# Patient Record
Sex: Male | Born: 1975 | Race: Black or African American | Hispanic: No | Marital: Single | Smoking: Current every day smoker
Health system: Southern US, Community
[De-identification: ages and names within clinical notes are randomized; demographics above are authoritative.]

## PROBLEM LIST (undated history)

## (undated) DIAGNOSIS — I10 Essential (primary) hypertension: Secondary | ICD-10-CM

## (undated) DIAGNOSIS — I2510 Atherosclerotic heart disease of native coronary artery without angina pectoris: Secondary | ICD-10-CM

## (undated) HISTORY — PX: KNEE SURGERY: SHX244

---

## 2018-03-07 ENCOUNTER — Emergency Department
Admission: EM | Admit: 2018-03-07 | Discharge: 2018-03-07 | Disposition: A | Discharge status: Home / Self Care | Payer: Self-pay | Attending: Emergency Medicine | Admitting: Emergency Medicine

## 2018-03-07 ENCOUNTER — Emergency Department: Payer: Self-pay

## 2018-03-07 ENCOUNTER — Other Ambulatory Visit: Payer: Self-pay

## 2018-03-07 DIAGNOSIS — R079 Chest pain, unspecified: Secondary | ICD-10-CM | POA: Insufficient documentation

## 2018-03-07 LAB — CBC
HCT: 45 % (ref 40.0–52.0)
HEMOGLOBIN: 15.5 g/dL (ref 13.0–18.0)
MCH: 30.7 pg (ref 26.0–34.0)
MCHC: 34.4 g/dL (ref 32.0–36.0)
MCV: 89.5 fL (ref 80.0–100.0)
PLATELETS: 200 10*3/uL (ref 150–440)
RBC: 5.03 MIL/uL (ref 4.40–5.90)
RDW: 13.2 % (ref 11.5–14.5)
WBC: 7.1 10*3/uL (ref 3.8–10.6)

## 2018-03-07 LAB — BASIC METABOLIC PANEL
ANION GAP: 9 (ref 5–15)
BUN: 12 mg/dL (ref 6–20)
CALCIUM: 9.1 mg/dL (ref 8.9–10.3)
CO2: 25 mmol/L (ref 22–32)
CREATININE: 1.33 mg/dL — AB (ref 0.61–1.24)
Chloride: 106 mmol/L (ref 101–111)
GLUCOSE: 120 mg/dL — AB (ref 65–99)
Potassium: 3.6 mmol/L (ref 3.5–5.1)
Sodium: 140 mmol/L (ref 135–145)

## 2018-03-07 LAB — TROPONIN I

## 2018-03-07 MED ORDER — ASPIRIN 81 MG PO CHEW
324.0000 mg | CHEWABLE_TABLET | Freq: Once | ORAL | Status: AC
  Administered 2018-03-07: 324 mg via ORAL
  Filled 2018-03-07: qty 4

## 2018-03-07 NOTE — Discharge Instructions (Addendum)
please return for further symptoms. Please give Dr. Glennis Brink office a call in the morning. At them know you're seen in the emergency room with chest pain. He should be able to see you for follow-up tomorrow or the next day.

## 2018-03-07 NOTE — ED Provider Notes (Signed)
[facility] Emergency Department Provider Note   ____________________________________________   First MD Initiated Contact with Patient 03/07/18 1928     (approximate)  I have reviewed the triage vital signs and the nursing notes.   HISTORY  Chief Complaint Chest Pain    HPI Steven Kirk is a 42 y.o. male Who reports he had some chest pressure in the middle of his chest*added as he got off work about 6:30 today. He felt heavy like someone was sitting on his chest. He was somewhat sweaty and short of breath. Pain is much better now almost gone. He reports he had something like this 3 weeks ago.   No past medical history on file. patient denies past medical problems There are no active problems to display for this patient.     Prior to Admission medications   Not on File    Allergies Patient has no known allergies.  No family history on file.  Social History Social History   Tobacco Use  . Smoking status: Not on file  Substance Use Topics  . Alcohol use: Not on file  . Drug use: Not on file    review of systems Constitutional: No fever/chills Eyes: No visual changes. ENT: No sore throat. Cardiovascular:  chest pain. Respiratory:  shortness of breath. Gastrointestinal: No abdominal pain.  No nausea, no vomiting.  No diarrhea.  No constipation. Genitourinary: Negative for dysuria. Musculoskeletal: Negative for back pain. Skin: Negative for rash. Neurological: Negative for headaches, focal weakness  ____________________________________________   PHYSICAL EXAM:  VITAL SIGNS: ED Triage Vitals  Enc Vitals Group     BP 03/07/18 1913 125/81     Pulse Rate 03/07/18 1913 66     Resp 03/07/18 1913 18     Temp 03/07/18 1913 98.9 F (37.2 C)     Temp Source 03/07/18 1913 Oral     SpO2 03/07/18 1913 97 %     Weight 03/07/18 1909 225 lb (102.1 kg)     Height 03/07/18 1909 5\' 9"  (1.753 m)     Head Circumference --      Peak Flow --        Pain Score 03/07/18 1908 6     Pain Loc --      Pain Edu? --      Excl. in GC? --     Constitutional: Alert and oriented. Well appearing and in no acute distress. Eyes: Conjunctivae are normal.  Head: Atraumatic. Nose: No congestion/rhinnorhea. Mouth/Throat: Mucous membranes are moist.  Oropharynx non-erythematous. Neck: No stridor. Cardiovascular: Normal rate, regular rhythm. Grossly normal heart sounds.  Good peripheral circulation. Respiratory: Normal respiratory effort.  No retractions. Lungs CTAB. Gastrointestinal: Soft and nontender. No distention. No abdominal bruits. No CVA tenderness. Musculoskeletal: No lower extremity tenderness nor edema.  No joint effusions. Neurologic:  Normal speech and language. No gross focal neurologic deficits are appreciated. No gait instability. Skin:  Skin is warm, dry and intact. No rash noted. Psychiatric: Mood and affect are normal. Speech and behavior are normal.  ____________________________________________   LABS (all labs ordered are listed, but only abnormal results are displayed)  Labs Reviewed  BASIC METABOLIC PANEL - Abnormal; Notable for the following components:      Result Value   Glucose, Bld 120 (*)    Creatinine, Ser 1.33 (*)    All other components within normal limits  CBC  TROPONIN I  TROPONIN I   ____________________________________________  EKG  EKG read and interpreted by me  shows normal sinus rhythm rate of 77 normal axis no acute changes ____________________________________________  RADIOLOGY  ED MD interpretation: chest x-ray reviewed by me shows no acute disease I agree with the radiologist   Official radiology report(s): Dg Chest 2 View  Result Date: 03/07/2018 CLINICAL DATA:  42 year old male with chest pain onset today. Mid sternal pain which is non radiating, shortness of breath. EXAM: CHEST - 2 VIEW COMPARISON:  None. FINDINGS: Cardiac size and mediastinal contours are within normal limits.  Visualized tracheal air column is within normal limits. Lung volumes are within normal limits. No pneumothorax, pulmonary edema, pleural effusion or confluent pulmonary opacity. No acute osseous abnormality identified. Negative visible bowel gas pattern. IMPRESSION: Within normal limits.  No acute cardiopulmonary abnormality. Electronically Signed   By: Odessa Fleming M.D.   On: 03/07/2018 20:43    ____________________________________________   PROCEDURES  Procedure(s) performed:   Procedures  Critical Care performed:   ____________________________________________   INITIAL IMPRESSION / ASSESSMENT AND PLAN / ED COURSE  patient's EKG chest x-ray and troponins are all essentially okay. Patient has no further chest pain. I'll have him follow-up with Dr. call wood the cardiologist tomorrow in the office.patient will return if he is worse. I believe it is okay to go home.        ____________________________________________   FINAL CLINICAL IMPRESSION(S) / ED DIAGNOSES  Final diagnoses:  Nonspecific chest pain     ED Discharge Orders    None       Note:  This document was prepared using Dragon voice recognition software and may include unintentional dictation errors.    Arnaldo Natal, MD 03/08/18 [phone]

## 2018-03-07 NOTE — ED Triage Notes (Signed)
Patient reports chest pain since earlier in the day, states pain mid sternal, non radiating with shortness of breath.

## 2018-11-21 ENCOUNTER — Emergency Department: Payer: Self-pay

## 2018-11-21 ENCOUNTER — Other Ambulatory Visit: Payer: Self-pay

## 2018-11-21 ENCOUNTER — Emergency Department
Admission: EM | Admit: 2018-11-21 | Discharge: 2018-11-21 | Disposition: A | Discharge status: Home / Self Care | Payer: Self-pay | Attending: Emergency Medicine | Admitting: Emergency Medicine

## 2018-11-21 DIAGNOSIS — R0789 Other chest pain: Secondary | ICD-10-CM | POA: Insufficient documentation

## 2018-11-21 DIAGNOSIS — R55 Syncope and collapse: Secondary | ICD-10-CM | POA: Insufficient documentation

## 2018-11-21 LAB — CBC
HCT: 43.4 % (ref 39.0–52.0)
HEMOGLOBIN: 14.8 g/dL (ref 13.0–17.0)
MCH: 31 pg (ref 26.0–34.0)
MCHC: 34.1 g/dL (ref 30.0–36.0)
MCV: 91 fL (ref 80.0–100.0)
Platelets: 186 10*3/uL (ref 150–400)
RBC: 4.77 MIL/uL (ref 4.22–5.81)
RDW: 11.9 % (ref 11.5–15.5)
WBC: 6 10*3/uL (ref 4.0–10.5)
nRBC: 0 % (ref 0.0–0.2)

## 2018-11-21 LAB — BASIC METABOLIC PANEL
ANION GAP: 8 (ref 5–15)
BUN: 15 mg/dL (ref 6–20)
CHLORIDE: 106 mmol/L (ref 98–111)
CO2: 25 mmol/L (ref 22–32)
Calcium: 9.2 mg/dL (ref 8.9–10.3)
Creatinine, Ser: 1.19 mg/dL (ref 0.61–1.24)
GFR calc Af Amer: >60 mL/min (ref >60)
Glucose, Bld: 140 mg/dL — ABNORMAL HIGH (ref 70–99)
POTASSIUM: 3.7 mmol/L (ref 3.5–5.1)
SODIUM: 139 mmol/L (ref 135–145)

## 2018-11-21 LAB — TROPONIN I

## 2018-11-21 NOTE — ED Notes (Signed)
Reviewed discharge instructions, follow-up care with patient. Patient verbalized understanding of all information reviewed. Patient stable, with no distress noted at this time.    

## 2018-11-21 NOTE — ED Triage Notes (Signed)
Central CP today, states goes to back. States SOB. Talking in complete sentences. Does not appear in any distress. A&O, ambulatory. No diaphoresis.

## 2018-11-21 NOTE — ED Provider Notes (Signed)
[facility] Emergency Department Provider Note ____________________________________________   First MD Initiated Contact with Patient 11/21/18 1827     (approximate)  I have reviewed the triage vital signs and the nursing notes.   HISTORY  Chief Complaint Chest Pain    HPI Steven Kirk is a 42 y.o. male with PMH as noted below who presents with near syncope, acute onset this afternoon when he was going to work, occurring after he ate a [place] for lunch, and consisting of feeling lightheaded, weak, and sweaty.  The patient states that that he then had some chest discomfort and shortness of breath which lasted a few minutes.  It has now completely resolved.  He reports having had a similar episode once a few months ago.   History reviewed. No pertinent past medical history.  There are no active problems to display for this patient.   Past Surgical History:  Procedure Laterality Date  . KNEE SURGERY      Prior to Admission medications   Not on File    Allergies Patient has no known allergies.  History reviewed. No pertinent family history.  Social History Social History   Tobacco Use  . Smoking status: Never Smoker  Substance Use Topics  . Alcohol use: Not Currently  . Drug use: Not on file    Review of Systems  Constitutional: No fever. Eyes: No redness. ENT: No sore throat. Cardiovascular: Positive for resolved chest discomfort. Respiratory: Positive for resolved shortness of breath. Gastrointestinal: No vomiting or diarrhea.  Genitourinary: Negative for flank pain. Musculoskeletal: Negative for back pain. Skin: Negative for rash. Neurological: Negative for headaches, focal weakness or numbness.   ____________________________________________   PHYSICAL EXAM:  VITAL SIGNS: ED Triage Vitals [11/21/18 1453]  Enc Vitals Group     BP (!) 134/91     Pulse Rate 69     Resp 18     Temp 98.2 F (36.8 C)     Temp Source Oral     SpO2 98 %     Weight 215 lb (97.5 kg)     Height 5\' 9"  (1.753 m)     Head Circumference      Peak Flow      Pain Score 6     Pain Loc      Pain Edu?      Excl. in GC?     Constitutional: Alert and oriented. Well appearing and in no acute distress. Eyes: Conjunctivae are normal.  EOMI.  PERRLA. Head: Atraumatic. Nose: No congestion/rhinnorhea. Mouth/Throat: Mucous membranes are moist.   Neck: Normal range of motion.  Cardiovascular: Normal rate, regular rhythm. Grossly normal heart sounds.  Good peripheral circulation. Respiratory: Normal respiratory effort.  No retractions. Lungs CTAB. Gastrointestinal: No distention.  Musculoskeletal:Extremities warm and well perfused.  Neurologic:  Normal speech and language.  Normal coordination and gait.  No gross focal neurologic deficits are appreciated.  Skin:  Skin is warm and dry. No rash noted. Psychiatric: Mood and affect are normal. Speech and behavior are normal.  ____________________________________________   LABS (all labs ordered are listed, but only abnormal results are displayed)  Labs Reviewed  BASIC METABOLIC PANEL - Abnormal; Notable for the following components:      Result Value   Glucose, Bld 140 (*)    All other components within normal limits  CBC  TROPONIN I   ____________________________________________  EKG  ED ECG REPORT I, Dionne Bucy, the attending physician, personally viewed and interpreted this ECG.  Date: 11/21/2018 EKG Time: 1451 Rate: 71 Rhythm: normal sinus rhythm QRS Axis: normal Intervals: normal ST/T Wave abnormalities: normal Narrative Interpretation: no evidence of acute ischemia  ____________________________________________  RADIOLOGY  CXR: No focal infiltrate  ____________________________________________   PROCEDURES  Procedure(s) performed: No  Procedures  Critical Care performed: No ____________________________________________   INITIAL IMPRESSION /  ASSESSMENT AND PLAN / ED COURSE  Pertinent labs & imaging results that were available during my care of the patient were reviewed by me and considered in my medical decision making (see chart for details).  42 year old male with no significant past medical history presents with near syncope earlier today associated with some chest discomfort and shortness of breath that appears to have been rather brief and has now resolved for several hours.  He states it happened not long after eating [place] food for lunch and while rushing to get to work.  I reviewed the past medical records in epic; the patient was seen in March of this year for a similar presentation.  He was ruled out for ACS and referred to a cardiologist although he states he did not go because he did not have insurance at that time.  He states that his insurance will be kicking in at the beginning of next month.  On exam the patient is well-appearing and at this time completely asymptomatic.  His vital signs are normal.  Lab work-up obtained from triage is also normal including troponin.  His EKG is nonischemic.  Overall the presentation is consistent with a vasovagal near syncope.  There is no evidence of ACS.  Given the completely resolved symptoms and highly atypical nature of the pain as well as the patient's age and lack of risk factors, there is no indication for further ED work-up or repeat labs.  He feels well and wants to go home.  I counseled the patient on the results of the work-up.  He agrees to follow-up this time, once his insurance kicks in in January.  I think that this would be appropriate.  I give the patient thorough return precautions and he expressed understanding.   ____________________________________________   FINAL CLINICAL IMPRESSION(S) / ED DIAGNOSES  Final diagnoses:  Near syncope  Atypical chest pain      NEW MEDICATIONS STARTED DURING THIS VISIT:  There are no discharge medications for this  patient.    Note:  This document was prepared using Dragon voice recognition software and may include unintentional dictation errors.    Dionne Bucy, MD 11/21/18 2010

## 2018-11-21 NOTE — Discharge Instructions (Addendum)
Return to the ER for new, worsening, persistent severe chest pain, difficulty breathing, lightheadedness, feeling like you are going to pass out, or any other new or worsening symptoms that concern you.  You should follow-up with a primary care doctor when you get your insurance in order next month.  Based on the symptoms you have had today it would be a good idea for you to follow-up with a cardiologist as well, and referral information is been provided.

## 2019-05-02 IMAGING — CR DG CHEST 2V
1 series · 2 of 2 positions shown · non-contrast
Comparison: None.

CLINICAL DATA: 41-year-old male with chest pain onset today. Mid
sternal pain which is non radiating, shortness of breath.

EXAM:
CHEST - 2 VIEW

[Series 1: dg chest 2 view · 0.14mm/px · 2 of 2 slices shown]
[im 1/2]
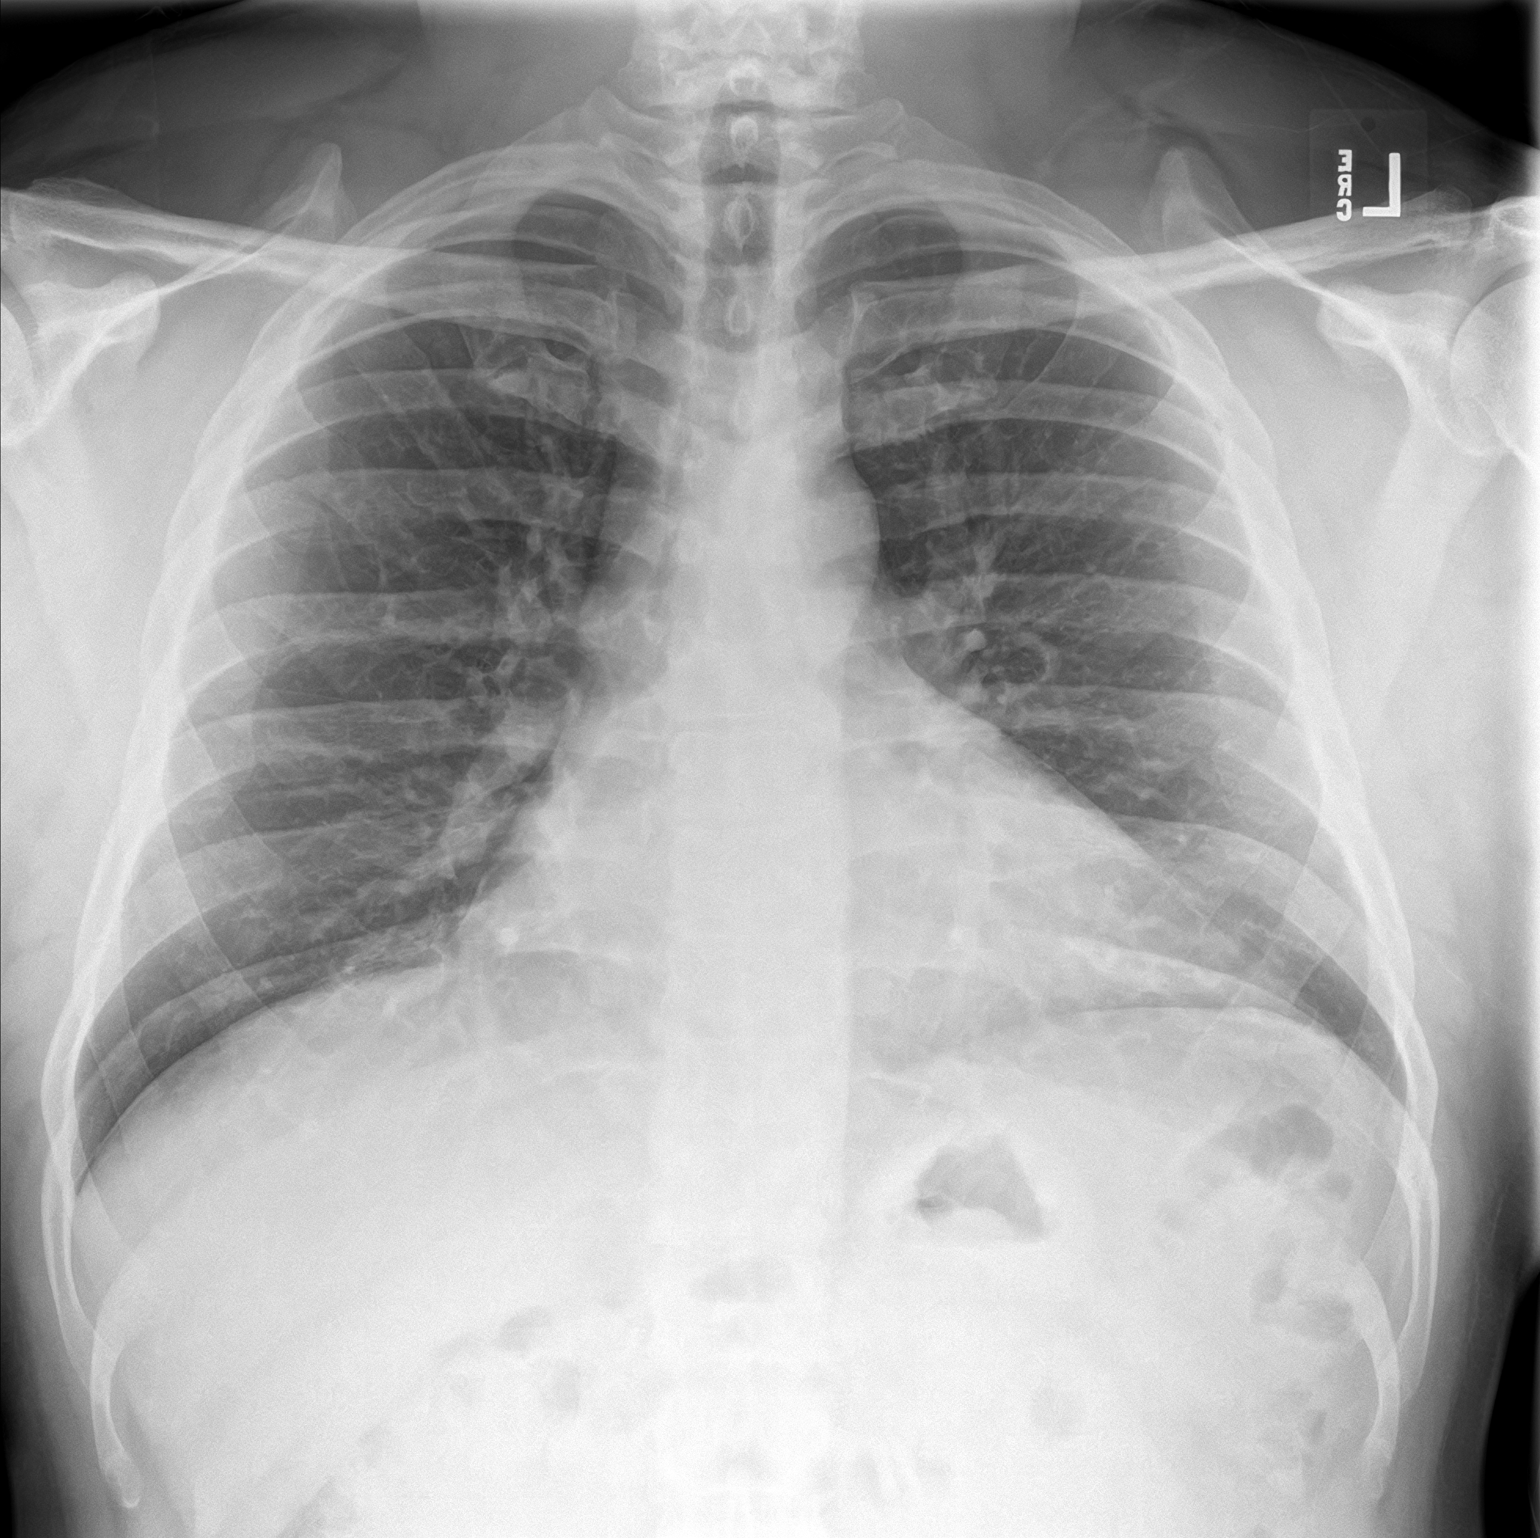
[im 2/2]
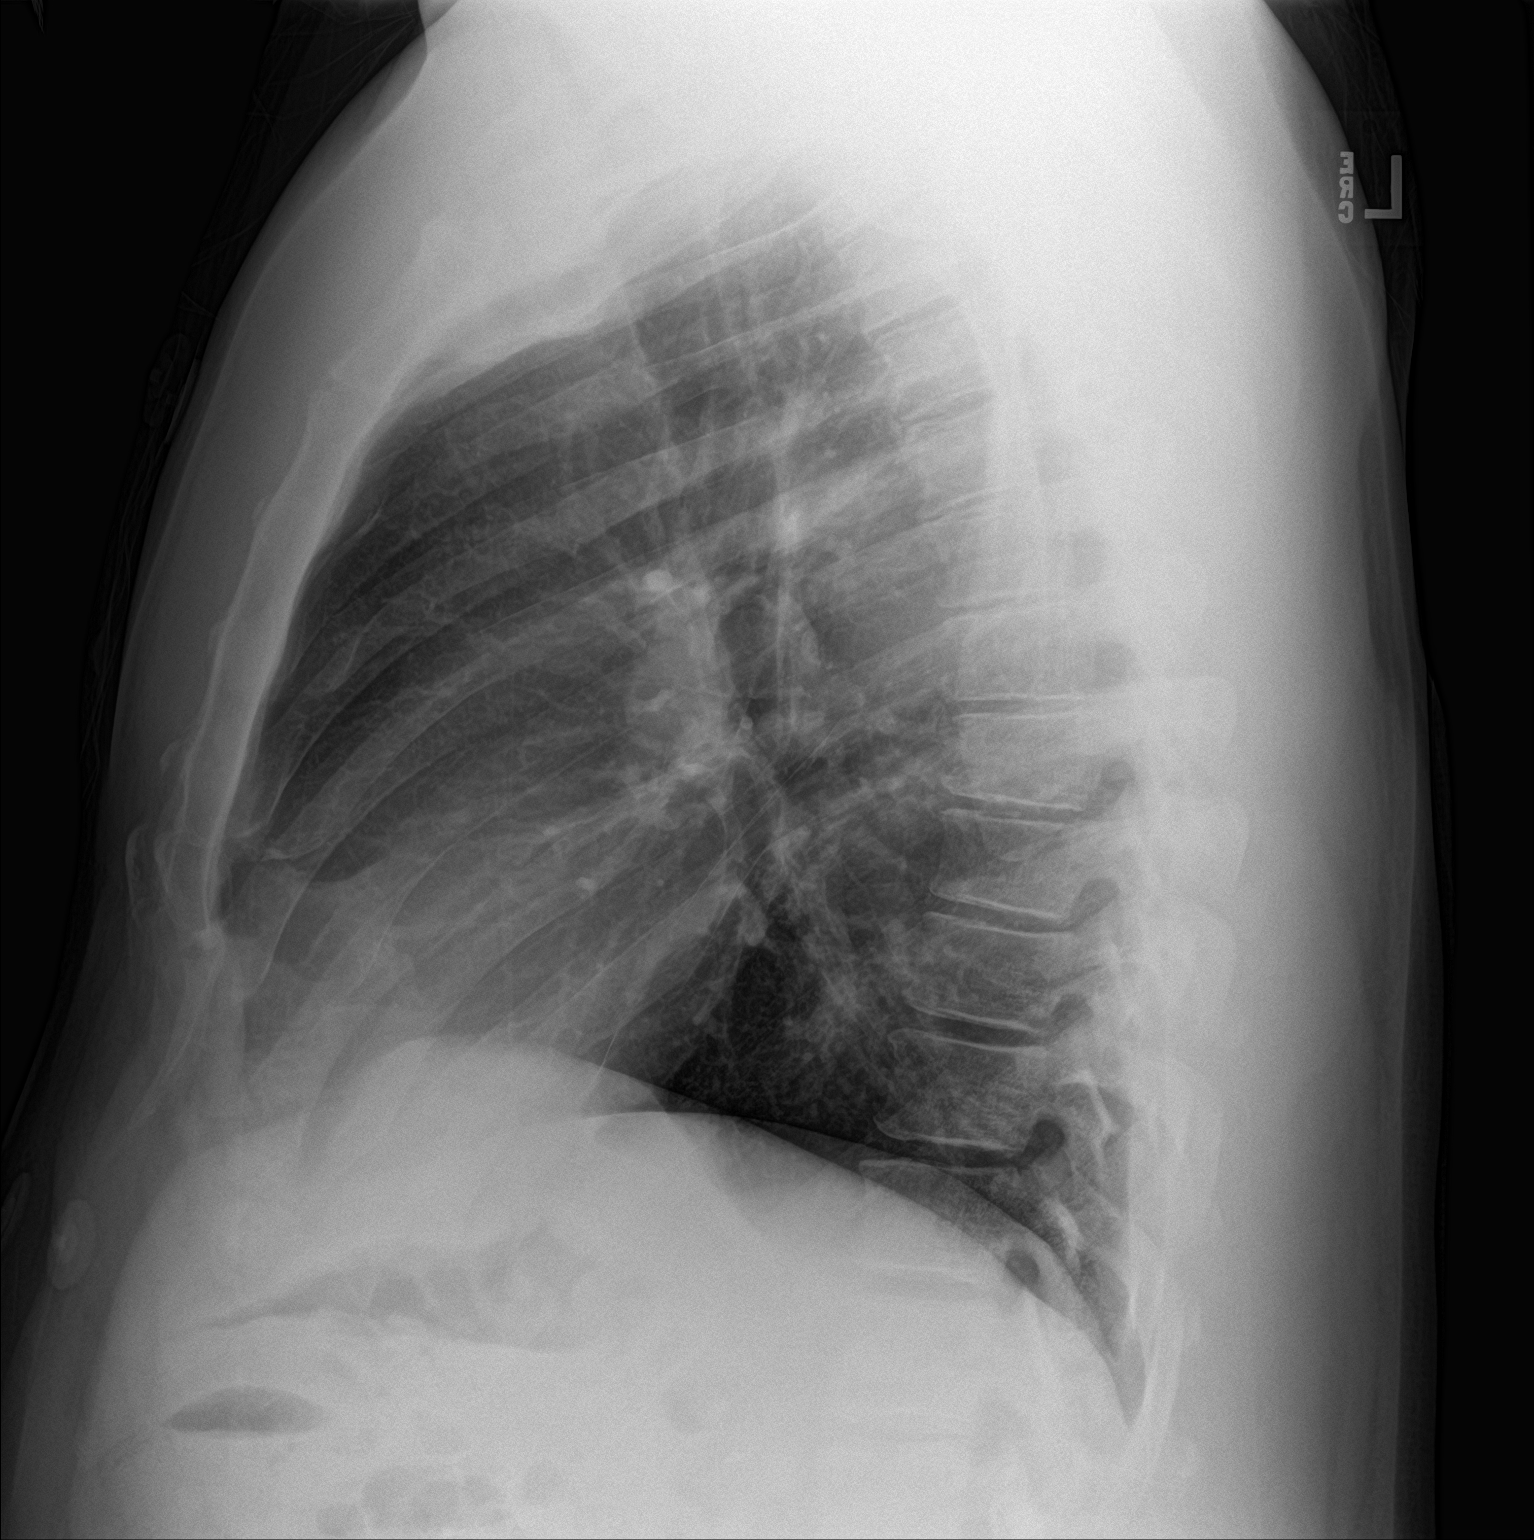

[2 of 2 positions shown; findings below may reference images not displayed]

FINDINGS: Cardiac size and mediastinal contours are within normal limits.
Visualized tracheal air column is within normal limits. Lung volumes
are within normal limits. No pneumothorax, pulmonary edema, pleural
effusion or confluent pulmonary opacity. No acute osseous
abnormality identified. Negative visible bowel gas pattern.
IMPRESSION: Within normal limits.  No acute cardiopulmonary abnormality.

## 2019-11-28 ENCOUNTER — Other Ambulatory Visit: Payer: Self-pay

## 2019-11-28 DIAGNOSIS — Z20822 Contact with and (suspected) exposure to covid-19: Secondary | ICD-10-CM

## 2019-11-30 LAB — NOVEL CORONAVIRUS, NAA: SARS-CoV-2, NAA: NOT DETECTED

## 2020-01-16 IMAGING — CR DG CHEST 2V
1 series · 2 of 2 positions shown · non-contrast
Comparison: 03/07/2018

CLINICAL DATA: Intermittent chest pain, shortness of breath

EXAM:
CHEST - 2 VIEW

[Series 1: dg chest 2 view · 0.14mm/px · 2 of 2 slices shown]
[im 1/2]
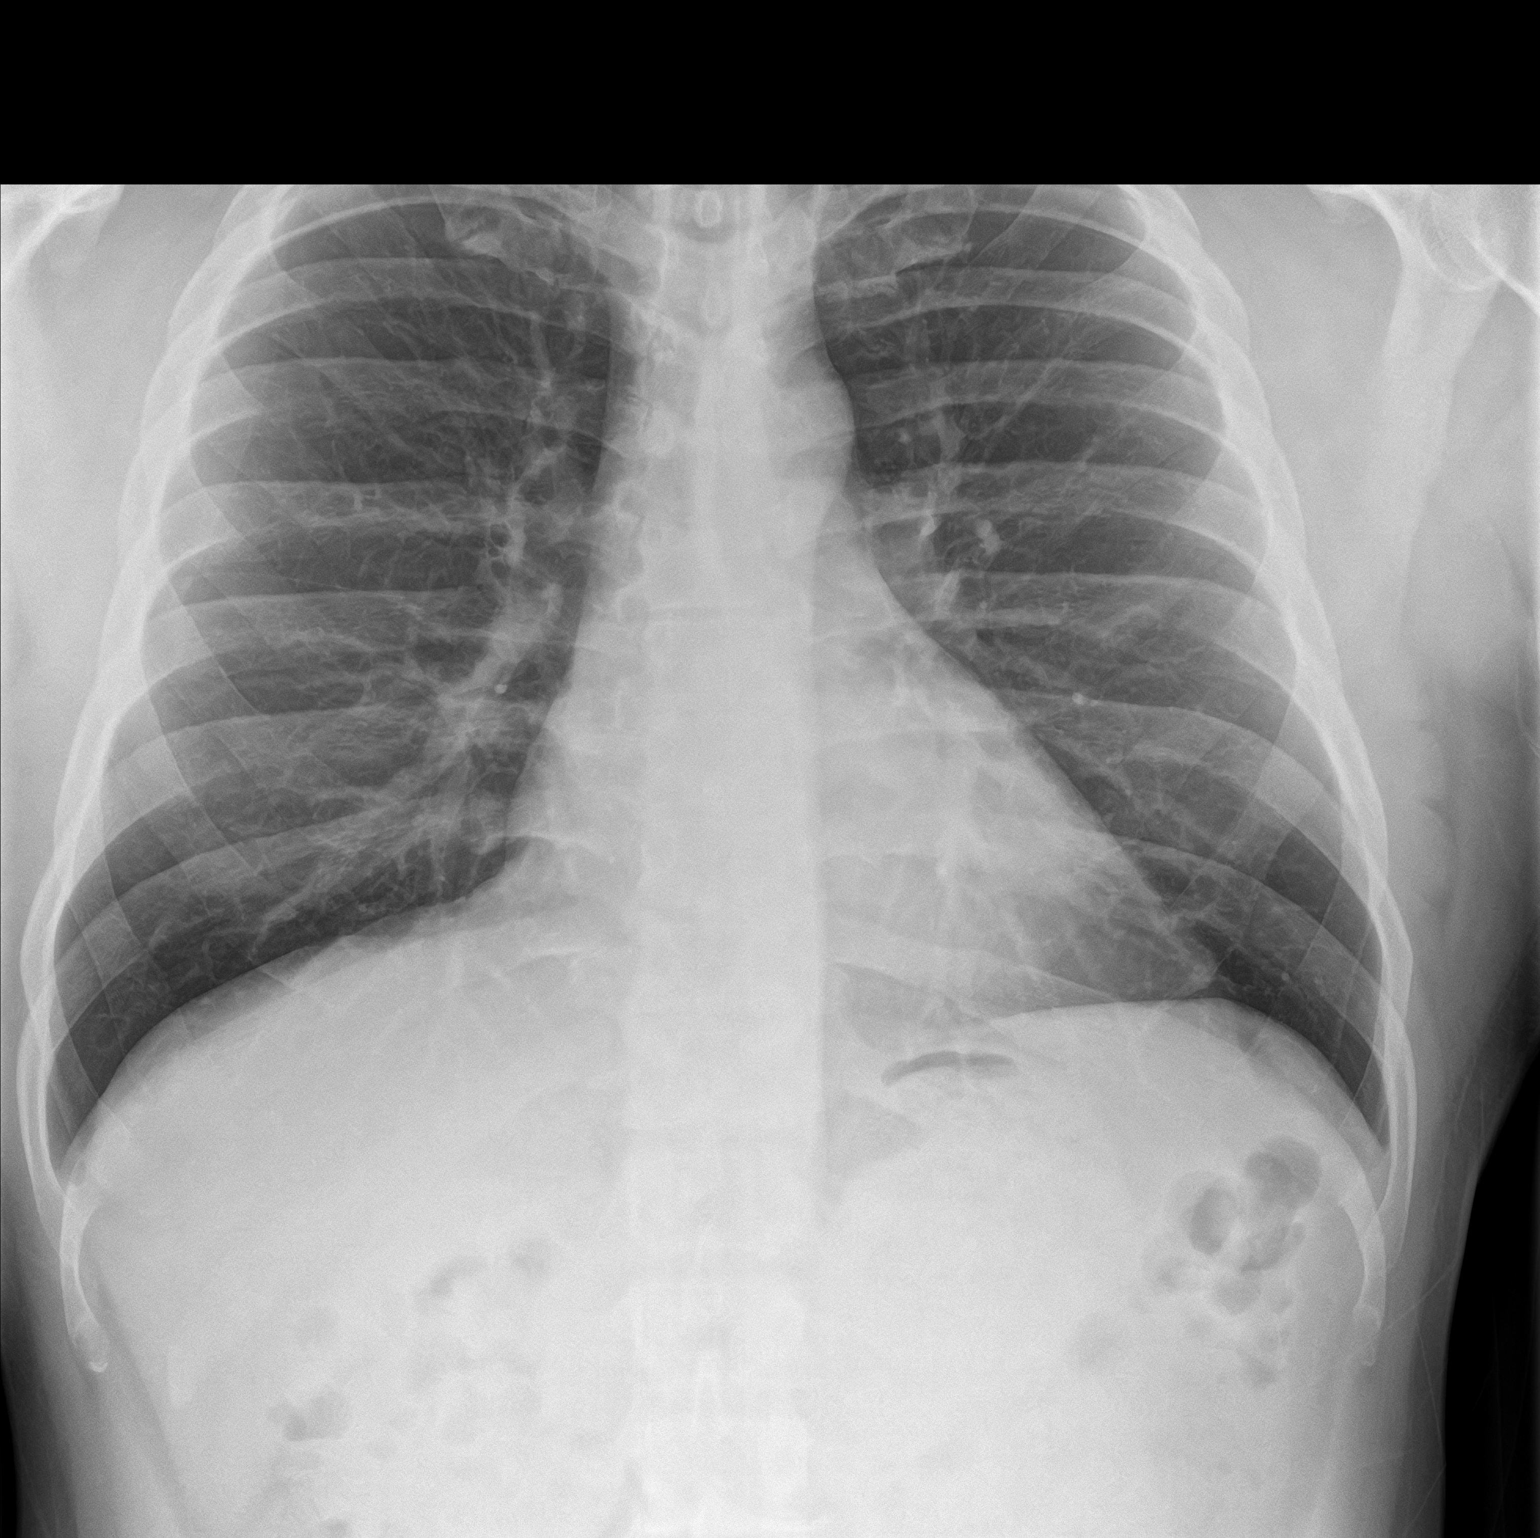
[im 2/2]
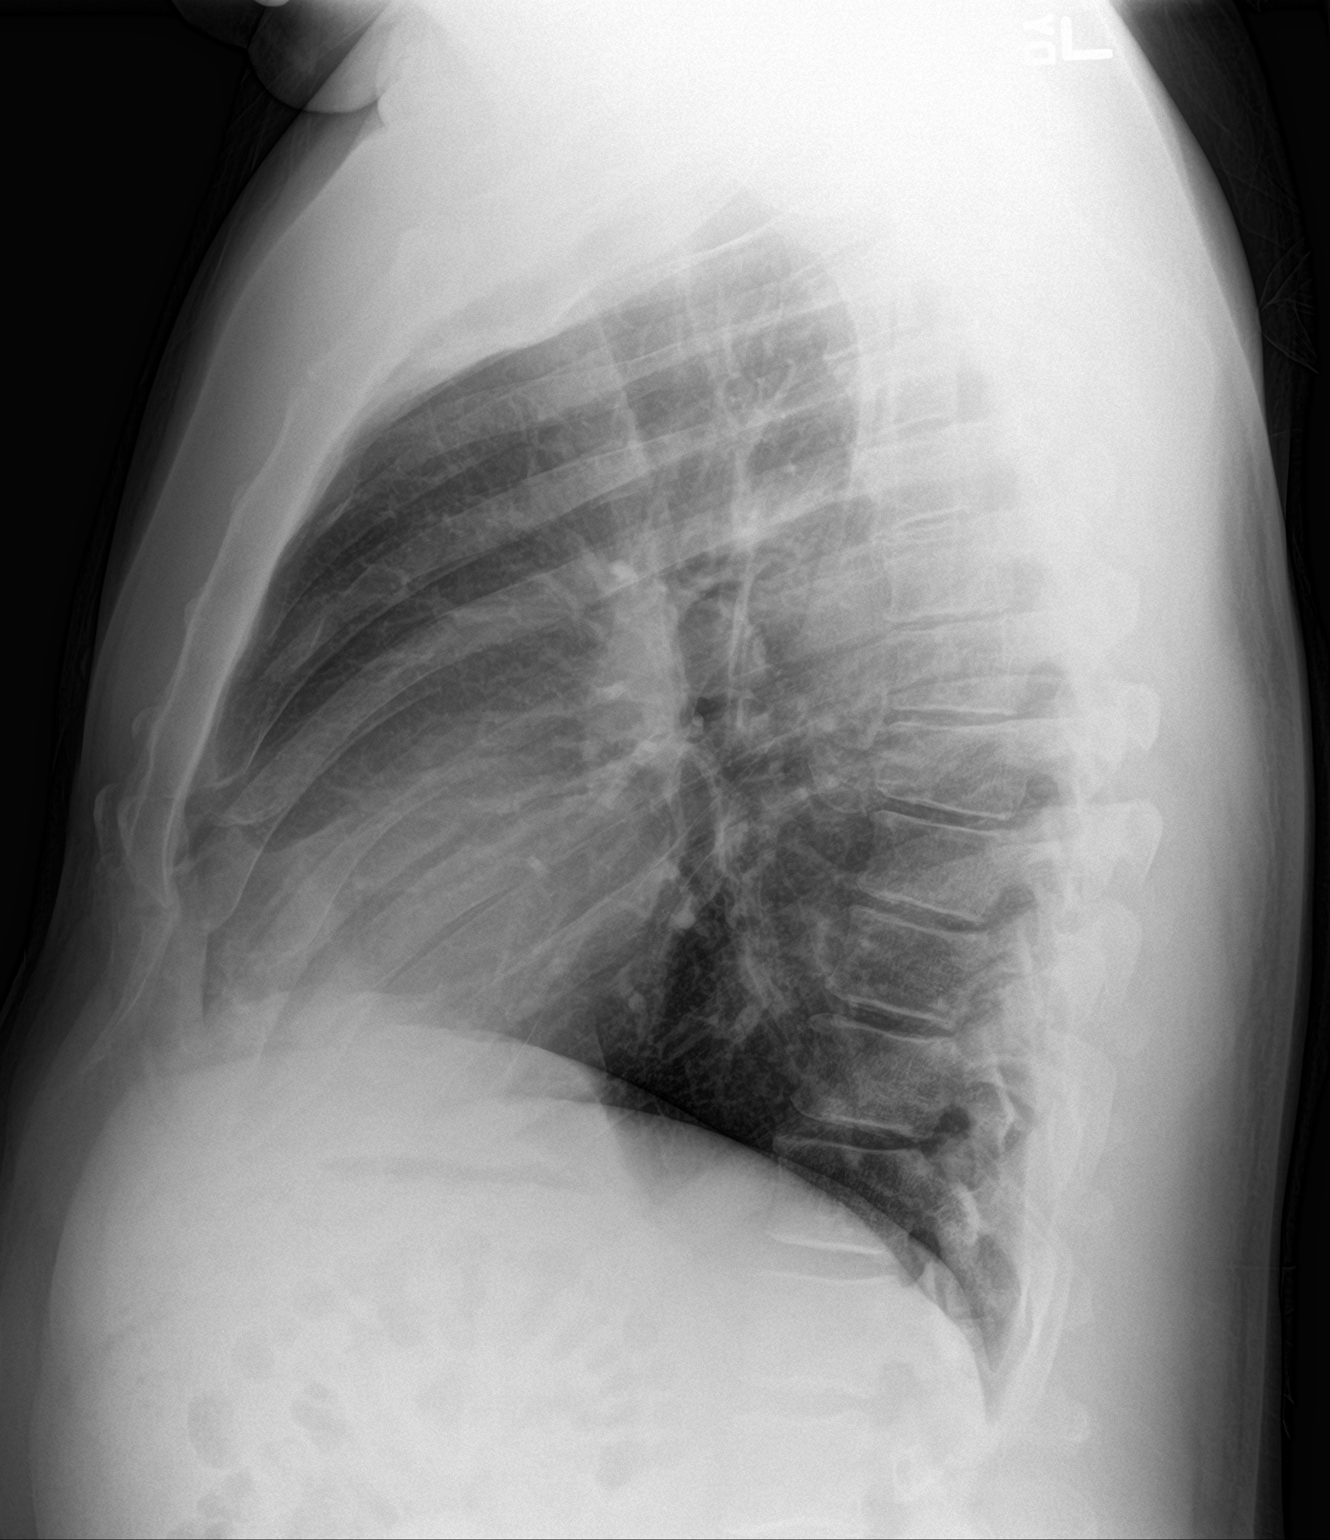

[2 of 2 positions shown; findings below may reference images not displayed]

FINDINGS: Lungs are clear.  No pleural effusion or pneumothorax.

The heart is top-normal in size.

Visualized osseous structures are within normal limits.
IMPRESSION: Normal chest radiographs.

## 2021-01-04 ENCOUNTER — Ambulatory Visit: Payer: Medicaid Other

## 2021-01-08 ENCOUNTER — Ambulatory Visit: Payer: BLUE CROSS/BLUE SHIELD | Admitting: Physician Assistant

## 2021-01-08 ENCOUNTER — Other Ambulatory Visit: Payer: Self-pay

## 2021-01-08 DIAGNOSIS — Z113 Encounter for screening for infections with a predominantly sexual mode of transmission: Secondary | ICD-10-CM

## 2021-01-08 LAB — GRAM STAIN

## 2021-01-08 NOTE — Progress Notes (Unsigned)
Gram stain reviewed; no tx per SO Ramatoulaye Pack, RN  

## 2021-01-09 ENCOUNTER — Encounter: Payer: Self-pay | Admitting: Physician Assistant

## 2021-01-09 NOTE — Progress Notes (Signed)
   [facility] Department STI clinic/screening visit  Subjective:  Steven Kirk is a 45 y.o. male being seen today for an STI screening visit. The patient reports they do not have symptoms.    Patient has the following medical conditions:  There are no problems to display for this patient.    Chief Complaint  Patient presents with  . SEXUALLY TRANSMITTED DISEASE    screening    HPI  Patient reports that he is not having any symptoms but would like a screening.  States that he had a condom break during sex and wants to make sure everything is good.  Denies chronic conditions and regular medicines.  States last HIV test was 4 years ago and last void prior to sample collection for Gram stain was 1.5 hr ago.   See flowsheet for further details and programmatic requirements.    The following portions of the patient's history were reviewed and updated as appropriate: allergies, current medications, past medical history, past social history, past surgical history and problem list.  Objective:  There were no vitals filed for this visit.  Physical Exam Constitutional:      General: He is not in acute distress.    Appearance: Normal appearance.  HENT:     Head: Normocephalic and atraumatic.     Comments: No nits,lice, or hair loss. No cervical, supraclavicular or axillary adenopathy.    Mouth/Throat:     Mouth: Mucous membranes are moist.     Pharynx: Oropharynx is clear. No oropharyngeal exudate or posterior oropharyngeal erythema.  Eyes:     Conjunctiva/sclera: Conjunctivae normal.  Pulmonary:     Effort: Pulmonary effort is normal.  Abdominal:     Palpations: Abdomen is soft. There is no mass.     Tenderness: There is no abdominal tenderness. There is no guarding or rebound.  Genitourinary:    Penis: Normal.      Testes: Normal.     Comments: Pubic area without nits, lice, hair loss, edema, erythema, lesions and inguinal adenopathy. Penis circumcised  without rash, lesions and discharge at meatus. Musculoskeletal:     Cervical back: Neck supple. No tenderness.  Skin:    General: Skin is warm and dry.     Findings: No bruising, erythema, lesion or rash.  Neurological:     Mental Status: He is alert and oriented to person, place, and time.  Psychiatric:        Mood and Affect: Mood normal.        Behavior: Behavior normal.        Thought Content: Thought content normal.        Judgment: Judgment normal.       Assessment and Plan:  Steven Kirk is a 45 y.o. male presenting to the [facility] Department for STI screening  1. Screening for STD (sexually transmitted disease) Patient into clinic without symptoms. Rec condoms with all sex. Await test results.  Counseled that RN will call if needs to RTC for treatment once results are back. - Gram stain - Gonococcus culture - HIV [facility] - Syphilis Serology, [facility]     No follow-ups on file.  No future appointments.  Steven Holmes, PA

## 2021-01-13 LAB — GONOCOCCUS CULTURE

## 2021-10-05 ENCOUNTER — Other Ambulatory Visit: Payer: Self-pay

## 2021-10-05 ENCOUNTER — Emergency Department
Admission: EM | Admit: 2021-10-05 | Discharge: 2021-10-05 | Disposition: A | Discharge status: Home / Self Care | Payer: BLUE CROSS/BLUE SHIELD | Attending: Emergency Medicine | Admitting: Emergency Medicine

## 2021-10-05 ENCOUNTER — Encounter: Payer: Self-pay | Admitting: Emergency Medicine

## 2021-10-05 DIAGNOSIS — B353 Tinea pedis: Secondary | ICD-10-CM | POA: Insufficient documentation

## 2021-10-05 DIAGNOSIS — M79672 Pain in left foot: Secondary | ICD-10-CM | POA: Diagnosis present

## 2021-10-05 MED ORDER — TERBINAFINE HCL 250 MG PO TABS: 250.0000 mg | ORAL_TABLET | Freq: Every day | ORAL | 0 refills | Status: DC

## 2021-10-05 MED ORDER — TERBINAFINE HCL 250 MG PO TABS: 250.0000 mg | ORAL_TABLET | Freq: Every day | ORAL | 0 refills | Status: AC

## 2021-10-05 NOTE — Discharge Instructions (Signed)
Use tinactin powder in your shoes and socks Lamisil cream between toes Spray your shower with a bleach product after showering Wash your sheets/towels with bleach added and dry on high heat Return if worsening

## 2021-10-05 NOTE — ED Provider Notes (Signed)
[facility] Emergency Department Provider Note  ____________________________________________   Event Date/Time   First MD Initiated Contact with Patient 10/05/21 [phone]     (approximate)  I have reviewed the triage vital signs and the nursing notes.   HISTORY  Chief Complaint Foot Pain    HPI Jatorian Dewayne Cassidy is a 45 y.o. male presents emergency department neck complaining of athlete's foot in between his toes on his left foot.  Symptoms have been ongoing for few weeks.  He tried multiple over-the-counter products without any relief.  No fever or chills.  No drainage from the area.  No redness   History reviewed. No pertinent past medical history.  There are no problems to display for this patient.   Past Surgical History:  Procedure Laterality Date   KNEE SURGERY      Prior to Admission medications   Medication Sig Start Date End Date Taking? Authorizing Provider  terbinafine (LAMISIL) 250 MG tablet Take 1 tablet (250 mg total) by mouth daily. 10/05/21 11/16/21  Faythe Ghee, PA-C    Allergies Patient has no known allergies.  No family history on file.  Social History Social History   Tobacco Use   Smoking status: Never   Smokeless tobacco: Never  Substance Use Topics   Alcohol use: Not Currently   Drug use: Not Currently    Review of Systems  Constitutional: No fever/chills Eyes: No visual changes. ENT: No sore throat. Respiratory: Denies cough Genitourinary: Negative for dysuria. Musculoskeletal: Negative for back pain.  Positive for left toe pain Skin: Negative for rash. Psychiatric: no mood changes,     ____________________________________________   PHYSICAL EXAM:  VITAL SIGNS: ED Triage Vitals  Enc Vitals Group     BP 10/05/21 0716 136/85     Pulse Rate 10/05/21 0716 98     Resp 10/05/21 0716 20     Temp 10/05/21 0716 98 F (36.7 C)     Temp Source 10/05/21 0716 Oral     SpO2 10/05/21 0716 99 %      Weight 10/05/21 0714 190 lb (86.2 kg)     Height 10/05/21 0714 5\' 9"  (1.753 m)     Head Circumference --      Peak Flow --      Pain Score 10/05/21 0714 10     Pain Loc --      Pain Edu? --      Excl. in GC? --     Constitutional: Alert and oriented. Well appearing and in no acute distress. Eyes: Conjunctivae are normal.  Head: Atraumatic. Nose: No congestion/rhinnorhea. Mouth/Throat: Mucous membranes are moist.   Neck:  supple no lymphadenopathy noted Cardiovascular: Normal rate, regular rhythm.  Respiratory: Normal respiratory effort.  No retractions,  GU: deferred Musculoskeletal: FROM all extremities, warm and well perfused Neurologic:  Normal speech and language.  Skin:  Skin is warm, dry and intact.  Tinea noted between the toes, mild swelling of the left fifth toe due to the amount of tinea noted, neurovascular is intact, no bony tenderness, no redness or pus noted Psychiatric: Mood and affect are normal. Speech and behavior are normal.  ____________________________________________   LABS (all labs ordered are listed, but only abnormal results are displayed)  Labs Reviewed - No data to display ____________________________________________   ____________________________________________  RADIOLOGY    ____________________________________________   PROCEDURES  Procedure(s) performed: No  Procedures    ____________________________________________   INITIAL IMPRESSION / ASSESSMENT AND PLAN / ED COURSE  Pertinent labs & imaging results that were available during my care of the patient were reviewed by me and considered in my medical decision making (see chart for details).   Patient is a 45 year old male presents with athlete's foot.  See HPI.  Physical exam shows patient appears stable.  Exam is consistent with tinea.  Went into great detail with patient on what to do for tinea.  We will add terbinafine 250 mg daily for 1 month.  He is to follow-up with  dermatology if not improving in that time.  Return emergency department worsening.  Discharged in stable condition.     Cordarrel Dewayne Eagles was evaluated in Emergency Department on 10/05/2021 for the symptoms described in the history of present illness. He was evaluated in the context of the global COVID-19 pandemic, which necessitated consideration that the patient might be at risk for infection with the SARS-CoV-2 virus that causes COVID-19. Institutional protocols and algorithms that pertain to the evaluation of patients at risk for COVID-19 are in a state of rapid change based on information released by regulatory bodies including the CDC and federal and state organizations. These policies and algorithms were followed during the patient's care in the ED.    As part of my medical decision making, I reviewed the following data within the electronic MEDICAL RECORD NUMBER Nursing notes reviewed and incorporated, Old chart reviewed, Notes from prior ED visits, and [place] Controlled Substance Database  ____________________________________________   FINAL CLINICAL IMPRESSION(S) / ED DIAGNOSES  Final diagnoses:  Athlete's foot on left      NEW MEDICATIONS STARTED DURING THIS VISIT:  Current Discharge Medication List     START taking these medications   Details  terbinafine (LAMISIL) 250 MG tablet Take 1 tablet (250 mg total) by mouth daily. Qty: 42 tablet, Refills: 0         Note:  This document was prepared using Dragon voice recognition software and may include unintentional dictation errors.    Faythe Ghee, PA-C 10/05/21 0825    Arnaldo Natal, MD 10/05/21 [phone]

## 2021-10-05 NOTE — ED Triage Notes (Signed)
Pt reports took a shower in someones elses shower and thinks he has an infection to a toe on his left foot.

## 2022-08-06 ENCOUNTER — Emergency Department: Payer: BLUE CROSS/BLUE SHIELD

## 2022-08-06 ENCOUNTER — Encounter: Payer: Self-pay | Admitting: Emergency Medicine

## 2022-08-06 DIAGNOSIS — M25551 Pain in right hip: Secondary | ICD-10-CM | POA: Diagnosis present

## 2022-08-06 DIAGNOSIS — W010XXA Fall on same level from slipping, tripping and stumbling without subsequent striking against object, initial encounter: Secondary | ICD-10-CM | POA: Diagnosis not present

## 2022-08-06 NOTE — ED Triage Notes (Signed)
Pt presents via EMS with complaints of right hip pain following a fall. He notes landing on his knees - denies pain in the legs but has had some severe leg pain. Pt ambulated to the car and was able to drive to his house but the pain was so bad he called 911. No deformities nor shortening noted. Pt took an unknown pain medication PTA.  Denies CP or SOB.

## 2022-08-07 ENCOUNTER — Emergency Department
Admission: EM | Admit: 2022-08-07 | Discharge: 2022-08-07 | Disposition: A | Discharge status: Home / Self Care | Payer: BLUE CROSS/BLUE SHIELD | Attending: Emergency Medicine | Admitting: Emergency Medicine

## 2022-08-07 ENCOUNTER — Emergency Department: Payer: BLUE CROSS/BLUE SHIELD

## 2022-08-07 DIAGNOSIS — I213 ST elevation (STEMI) myocardial infarction of unspecified site: Secondary | ICD-10-CM | POA: Diagnosis not present

## 2022-08-07 DIAGNOSIS — W19XXXA Unspecified fall, initial encounter: Secondary | ICD-10-CM

## 2022-08-07 DIAGNOSIS — M25551 Pain in right hip: Secondary | ICD-10-CM

## 2022-08-07 DIAGNOSIS — I2119 ST elevation (STEMI) myocardial infarction involving other coronary artery of inferior wall: Secondary | ICD-10-CM | POA: Diagnosis not present

## 2022-08-07 MED ORDER — FENTANYL CITRATE PF 50 MCG/ML IJ SOSY
50.0000 ug | PREFILLED_SYRINGE | INTRAMUSCULAR | Status: DC | PRN
  Administered 2022-08-07: 50 ug via INTRAVENOUS
  Filled 2022-08-07: qty 1

## 2022-08-07 MED ORDER — DOCUSATE SODIUM 100 MG PO CAPS: ORAL_CAPSULE | ORAL | 0 refills | Status: DC

## 2022-08-07 MED ORDER — HYDROCODONE-ACETAMINOPHEN 5-325 MG PO TABS
2.0000 | ORAL_TABLET | Freq: Once | ORAL | Status: AC
  Administered 2022-08-07: 2 via ORAL
  Filled 2022-08-07: qty 2

## 2022-08-07 MED ORDER — OXYCODONE-ACETAMINOPHEN 5-325 MG PO TABS: 2.0000 | ORAL_TABLET | Freq: Four times a day (QID) | ORAL | 0 refills | Status: DC | PRN

## 2022-08-07 NOTE — ED Notes (Signed)
Patient transported to CT 

## 2022-08-07 NOTE — ED Provider Notes (Signed)
[facility] Provider Note    Event Date/Time   First MD Initiated Contact with Patient 08/07/22 0310     (approximate)   History   Hip Pain   HPI  Steven Kirk is a 46 y.o. male who reports no medical issues other than some intermittent pain in his hips, merrily on the right side, that has been going on for several months.  He presents tonight for severe pain in his right hip after a fall earlier tonight.  He said that he was on the grass and tripped and stumbled, falling on his left hip.  However it is not the left hip that hurts, it is the right.  The onset was after the initial fall.  There is no deformity and he is able to ambulate but it causes a lot of pain to do so.  He said that he drove to his house but then the pain was so bad he called 911.  He had no other injuries and no other recent traumas.  He did not hit his head and has no pain in his neck or chest.  No abdominal pain.  No nausea or vomiting.  He received fentanyl 50 mcg IV and he is currently resting comfortably but has pain when he moves.  He is not on blood thinners.     Physical Exam   Triage Vital Signs: ED Triage Vitals  Enc Vitals Group     BP 08/06/22 2254 (!) 155/94     Pulse Rate 08/06/22 2254 63     Resp 08/06/22 2254 20     Temp 08/06/22 2254 98.6 F (37 C)     Temp Source 08/06/22 2254 Oral     SpO2 08/06/22 2254 100 %     Weight 08/06/22 2256 91 kg (200 lb 9.9 oz)     Height 08/06/22 2256 1.753 m (5\' 9" )     Head Circumference --      Peak Flow --      Pain Score 08/06/22 2253 9     Pain Loc --      Pain Edu? --      Excl. in GC? --     Most recent vital signs: Vitals:   08/07/22 0220 08/07/22 0343  BP: (!) 151/101 (!) 140/82  Pulse: 79 71  Resp: 18 18  Temp:  98.6 F (37 C)  SpO2: 99% 99%     General: Awake, no distress.  CV:  Good peripheral perfusion.  Resp:  Normal effort.  Abd:  No distention.  Other:  No obvious signs of trauma.  The  patient has reproducible pain and tenderness to palpation when he moves or when his right hip is palpated.  His pelvis is stable.  He is able to move it and bear weight but it is painful to do so.  He has no obvious contusion, abrasion, hematoma, discoloration along his flank or his right hip, or other evidence of trauma.   ED Results / Procedures / Treatments   Labs (all labs ordered are listed, but only abnormal results are displayed) Labs Reviewed - No data to display    RADIOLOGY I viewed and interpreted the patient's right hip and pelvis x-rays.  I did not appreciate any fracture nor dislocation, but the radiologist was concerned about a subtle line along the femoral neck and recommended a CT scan for additional evaluation.  I then viewed and interpreted his right hip CT without contrast.  I  do not see any evidence of fracture nor dislocation on the CT scan either.  I also read the radiologist's report, which confirmed no acute findings, including no fracture or evidence of hematoma.    PROCEDURES:  Critical Care performed: No  Procedures   MEDICATIONS ORDERED IN ED: Medications  fentaNYL (SUBLIMAZE) injection 50 mcg (50 mcg Intravenous Given 08/07/22 0252)  HYDROcodone-acetaminophen (NORCO/VICODIN) 5-325 MG per tablet 2 tablet (2 tablets Oral Given 08/07/22 0514)     IMPRESSION / MDM / ASSESSMENT AND PLAN / ED COURSE  I reviewed the triage vital signs and the nursing notes.                              Differential diagnosis includes, but is not limited to, hip/femur fracture or dislocation, pelvic fracture, hematoma, musculoskeletal or soft tissue strain.  Patient's presentation is most consistent with acute complicated illness / injury requiring diagnostic workup.  No evidence of any injury other than the pain in his right hip which is the opposite side from that on which he fell.  Ambulatory but with pain.  As documented above, we initially ordered x-rays and they were  nondiagnostic with the radiologist recommending a CT scan for additional evaluation.  I then ordered a CT of the right hip without contrast, and there are no acute findings on the CT scan to explain his pain.  He is otherwise well and appears to be in good shape with no comorbidities that would increase my concern for severe or emergent medical condition.  I talked with the patient and he agrees with the plan for pain management and crutches as needed, weightbearing as tolerated, and orthopedics follow-up.  I reviewed the [place] controlled substance database and I see no evidence of any controlled substances prescribed for him recently so I wrote him a prescription for Percocet.  I gave him follow-up information with Dr. Okey Dupre for outpatient orthopedics.  The patient understands and agrees with the plan and I gave my usual and customary return precautions.       FINAL CLINICAL IMPRESSION(S) / ED DIAGNOSES   Final diagnoses:  Fall, initial encounter  Acute pain of right hip     Rx / DC Orders   ED Discharge Orders          Ordered    oxyCODONE-acetaminophen (PERCOCET) 5-325 MG tablet  Every 6 hours PRN        08/07/22 0521    docusate sodium (COLACE) 100 MG capsule        08/07/22 0521             Note:  This document was prepared using Dragon voice recognition software and may include unintentional dictation errors.   Loleta Rose, MD 08/07/22 [phone]

## 2022-08-07 NOTE — Discharge Instructions (Signed)
As we discussed, although you are having pain in your right hip, there is no indication that you have broken a bone or have any dislocation.  It appears that your fall has caused you to pull some muscles or have a bruise or contusion in your right hip, and this should improve relatively quickly although you may still be sore for a week or more.  Please bear weight as tolerated but use the crutches as needed for stability.  Use over-the-counter ibuprofen and/or Tylenol and either heating pads or cool packs, whichever makes your leg feel better.    Take Percocet as prescribed for severe pain. Do not drink alcohol, drive or participate in any other potentially dangerous activities while taking this medication as it may make you sleepy. Do not take this medication with any other sedating medications, either prescription or over-the-counter. If you were prescribed Percocet or Vicodin, do not take these with acetaminophen (Tylenol) as it is already contained within these medications.   This medication is an opiate (or narcotic) pain medication and can be habit forming.  Use it as little as possible to achieve adequate pain control.  Do not use or use it with extreme caution if you have a history of opiate abuse or dependence.  If you are on a pain contract with your primary care doctor or a pain specialist, be sure to let them know you were prescribed this medication today from the [facility].  This medication is intended for your use only - do not give any to anyone else and keep it in a secure place where nobody else, especially children, have access to it.  It will also cause or worsen constipation, so you may want to consider taking an over-the-counter stool softener while you are taking this medication.  Please follow-up with Dr. Okey Dupre or one of his colleagues in the orthopedics clinic at the next available opportunity for additional management recommendations and any additional  outpatient evaluation they feel is necessary.  Return to the emergency department if you develop new or worsening symptoms that concern you.

## 2022-08-10 ENCOUNTER — Emergency Department: Payer: BLUE CROSS/BLUE SHIELD

## 2022-08-10 ENCOUNTER — Encounter: Payer: Self-pay | Admitting: Intensive Care

## 2022-08-10 ENCOUNTER — Other Ambulatory Visit: Payer: Self-pay

## 2022-08-10 ENCOUNTER — Inpatient Hospital Stay
Admission: EM | Admit: 2022-08-10 | Discharge: 2022-08-12 | DRG: 251 | Disposition: A | Discharge status: Home / Self Care | Payer: BLUE CROSS/BLUE SHIELD | Attending: Internal Medicine | Admitting: Internal Medicine

## 2022-08-10 ENCOUNTER — Encounter
Admission: EM | Disposition: A | Discharge status: Home / Self Care | Payer: Self-pay | Source: Home / Self Care | Attending: Internal Medicine

## 2022-08-10 DIAGNOSIS — I2510 Atherosclerotic heart disease of native coronary artery without angina pectoris: Secondary | ICD-10-CM | POA: Diagnosis present

## 2022-08-10 DIAGNOSIS — E785 Hyperlipidemia, unspecified: Secondary | ICD-10-CM | POA: Diagnosis present

## 2022-08-10 DIAGNOSIS — I219 Acute myocardial infarction, unspecified: Secondary | ICD-10-CM | POA: Insufficient documentation

## 2022-08-10 DIAGNOSIS — F1290 Cannabis use, unspecified, uncomplicated: Secondary | ICD-10-CM | POA: Diagnosis present

## 2022-08-10 DIAGNOSIS — I2119 ST elevation (STEMI) myocardial infarction involving other coronary artery of inferior wall: Secondary | ICD-10-CM | POA: Diagnosis present

## 2022-08-10 DIAGNOSIS — R030 Elevated blood-pressure reading, without diagnosis of hypertension: Secondary | ICD-10-CM

## 2022-08-10 DIAGNOSIS — I214 Non-ST elevation (NSTEMI) myocardial infarction: Secondary | ICD-10-CM | POA: Diagnosis present

## 2022-08-10 DIAGNOSIS — Z79899 Other long term (current) drug therapy: Secondary | ICD-10-CM | POA: Diagnosis not present

## 2022-08-10 DIAGNOSIS — I213 ST elevation (STEMI) myocardial infarction of unspecified site: Secondary | ICD-10-CM

## 2022-08-10 DIAGNOSIS — I2582 Chronic total occlusion of coronary artery: Secondary | ICD-10-CM | POA: Diagnosis present

## 2022-08-10 DIAGNOSIS — N179 Acute kidney failure, unspecified: Secondary | ICD-10-CM | POA: Diagnosis present

## 2022-08-10 DIAGNOSIS — F17210 Nicotine dependence, cigarettes, uncomplicated: Secondary | ICD-10-CM | POA: Diagnosis present

## 2022-08-10 DIAGNOSIS — I10 Essential (primary) hypertension: Secondary | ICD-10-CM | POA: Diagnosis present

## 2022-08-10 DIAGNOSIS — Z720 Tobacco use: Secondary | ICD-10-CM | POA: Diagnosis present

## 2022-08-10 DIAGNOSIS — M160 Bilateral primary osteoarthritis of hip: Secondary | ICD-10-CM | POA: Diagnosis present

## 2022-08-10 HISTORY — PX: CORONARY/GRAFT ACUTE MI REVASCULARIZATION: CATH118305

## 2022-08-10 HISTORY — PX: LEFT HEART CATH AND CORONARY ANGIOGRAPHY: CATH118249

## 2022-08-10 LAB — CBC
HCT: 43.9 % (ref 39.0–52.0)
Hemoglobin: 14.8 g/dL (ref 13.0–17.0)
MCH: 30.2 pg (ref 26.0–34.0)
MCHC: 33.7 g/dL (ref 30.0–36.0)
MCV: 89.6 fL (ref 80.0–100.0)
Platelets: 211 10*3/uL (ref 150–400)
RBC: 4.9 MIL/uL (ref 4.22–5.81)
RDW: 11.9 % (ref 11.5–15.5)
WBC: 9.7 10*3/uL (ref 4.0–10.5)
nRBC: 0 % (ref 0.0–0.2)

## 2022-08-10 LAB — BASIC METABOLIC PANEL
Anion gap: 12 (ref 5–15)
BUN: 13 mg/dL (ref 6–20)
CO2: 24 mmol/L (ref 22–32)
Calcium: 9.5 mg/dL (ref 8.9–10.3)
Chloride: 102 mmol/L (ref 98–111)
Creatinine, Ser: 1.29 mg/dL — ABNORMAL HIGH (ref 0.61–1.24)
GFR, Estimated: >60 mL/min (ref >60)
Glucose, Bld: 154 mg/dL — ABNORMAL HIGH (ref 70–99)
Potassium: 3.7 mmol/L (ref 3.5–5.1)
Sodium: 138 mmol/L (ref 135–145)

## 2022-08-10 LAB — GLUCOSE, CAPILLARY: Glucose-Capillary: 125 mg/dL — ABNORMAL HIGH (ref 70–99)

## 2022-08-10 LAB — TROPONIN I (HIGH SENSITIVITY)
Troponin I (High Sensitivity): 30 ng/L — ABNORMAL HIGH
Troponin I (High Sensitivity): 538 ng/L

## 2022-08-10 LAB — MRSA NEXT GEN BY PCR, NASAL: MRSA by PCR Next Gen: NOT DETECTED

## 2022-08-10 SURGERY — CORONARY/GRAFT ACUTE MI REVASCULARIZATION
Anesthesia: Moderate Sedation

## 2022-08-10 MED ORDER — CLOPIDOGREL BISULFATE 75 MG PO TABS
ORAL_TABLET | ORAL | Status: AC
  Filled 2022-08-10: qty 4

## 2022-08-10 MED ORDER — SODIUM CHLORIDE 0.9 % IV SOLN: INTRAVENOUS | Status: AC

## 2022-08-10 MED ORDER — VERAPAMIL HCL 2.5 MG/ML IV SOLN
INTRAVENOUS | Status: DC | PRN
  Administered 2022-08-10: 2.5 mg via INTRA_ARTERIAL

## 2022-08-10 MED ORDER — MIDAZOLAM HCL 2 MG/2ML IJ SOLN
INTRAMUSCULAR | Status: AC
  Filled 2022-08-10: qty 2

## 2022-08-10 MED ORDER — LIDOCAINE HCL (PF) 1 % IJ SOLN
INTRAMUSCULAR | Status: DC | PRN
  Administered 2022-08-10: 2 mL

## 2022-08-10 MED ORDER — SODIUM CHLORIDE 0.9% FLUSH
3.0000 mL | Freq: Two times a day (BID) | INTRAVENOUS | Status: DC
  Administered 2022-08-11 – 2022-08-12 (×3): 3 mL via INTRAVENOUS

## 2022-08-10 MED ORDER — ACETAMINOPHEN 325 MG PO TABS: 650.0000 mg | ORAL_TABLET | ORAL | Status: DC | PRN

## 2022-08-10 MED ORDER — IOHEXOL 300 MG/ML  SOLN
INTRAMUSCULAR | Status: DC | PRN
  Administered 2022-08-10: 190 mL

## 2022-08-10 MED ORDER — HEPARIN SODIUM (PORCINE) 1000 UNIT/ML IJ SOLN
4000.0000 [IU] | Freq: Once | INTRAMUSCULAR | Status: AC
  Administered 2022-08-10: 4000 [IU] via INTRAVENOUS

## 2022-08-10 MED ORDER — SODIUM CHLORIDE 0.9% FLUSH: 3.0000 mL | INTRAVENOUS | Status: DC | PRN

## 2022-08-10 MED ORDER — ATORVASTATIN CALCIUM 20 MG PO TABS
80.0000 mg | ORAL_TABLET | Freq: Every day | ORAL | Status: DC
  Administered 2022-08-10 – 2022-08-12 (×3): 80 mg via ORAL
  Filled 2022-08-10 (×3): qty 4

## 2022-08-10 MED ORDER — CHLORHEXIDINE GLUCONATE CLOTH 2 % EX PADS
6.0000 | MEDICATED_PAD | Freq: Every day | CUTANEOUS | Status: DC
  Administered 2022-08-11 – 2022-08-12 (×2): 6 via TOPICAL

## 2022-08-10 MED ORDER — ONDANSETRON HCL 4 MG/2ML IJ SOLN
INTRAMUSCULAR | Status: DC | PRN
  Administered 2022-08-10: 4 mg via INTRAVENOUS

## 2022-08-10 MED ORDER — CARVEDILOL 6.25 MG PO TABS: 6.2500 mg | ORAL_TABLET | Freq: Two times a day (BID) | ORAL | Status: DC

## 2022-08-10 MED ORDER — CLOPIDOGREL BISULFATE 75 MG PO TABS
75.0000 mg | ORAL_TABLET | Freq: Every day | ORAL | Status: DC
  Administered 2022-08-11 – 2022-08-12 (×2): 75 mg via ORAL
  Filled 2022-08-10 (×2): qty 1

## 2022-08-10 MED ORDER — ONDANSETRON HCL 4 MG/2ML IJ SOLN: 4.0000 mg | Freq: Four times a day (QID) | INTRAMUSCULAR | Status: DC | PRN

## 2022-08-10 MED ORDER — VERAPAMIL HCL 2.5 MG/ML IV SOLN
INTRAVENOUS | Status: AC
  Filled 2022-08-10: qty 2

## 2022-08-10 MED ORDER — LIDOCAINE HCL 1 % IJ SOLN
INTRAMUSCULAR | Status: AC
  Filled 2022-08-10: qty 20

## 2022-08-10 MED ORDER — HEPARIN SODIUM (PORCINE) 1000 UNIT/ML IJ SOLN
INTRAMUSCULAR | Status: DC | PRN
  Administered 2022-08-10: 4000 [IU] via INTRAVENOUS

## 2022-08-10 MED ORDER — ASPIRIN 81 MG PO CHEW
324.0000 mg | CHEWABLE_TABLET | Freq: Once | ORAL | Status: AC
  Administered 2022-08-10: 324 mg via ORAL

## 2022-08-10 MED ORDER — NITROGLYCERIN 1 MG/10 ML FOR IR/CATH LAB
INTRA_ARTERIAL | Status: DC | PRN
  Administered 2022-08-10: 200 ug via INTRACORONARY

## 2022-08-10 MED ORDER — ENOXAPARIN SODIUM 40 MG/0.4ML IJ SOSY
40.0000 mg | PREFILLED_SYRINGE | INTRAMUSCULAR | Status: DC
  Administered 2022-08-11 – 2022-08-12 (×2): 40 mg via SUBCUTANEOUS
  Filled 2022-08-10 (×2): qty 0.4

## 2022-08-10 MED ORDER — ASPIRIN 81 MG PO CHEW
81.0000 mg | CHEWABLE_TABLET | Freq: Every day | ORAL | Status: DC
  Administered 2022-08-11 – 2022-08-12 (×2): 81 mg via ORAL
  Filled 2022-08-10 (×2): qty 1

## 2022-08-10 MED ORDER — SODIUM CHLORIDE 0.9 % IV SOLN: 250.0000 mL | INTRAVENOUS | Status: DC | PRN

## 2022-08-10 MED ORDER — MIDAZOLAM HCL 2 MG/2ML IJ SOLN
INTRAMUSCULAR | Status: DC | PRN
  Administered 2022-08-10: 1 mg via INTRAVENOUS

## 2022-08-10 MED ORDER — FENTANYL CITRATE (PF) 100 MCG/2ML IJ SOLN
INTRAMUSCULAR | Status: DC | PRN
  Administered 2022-08-10: 25 ug via INTRAVENOUS

## 2022-08-10 MED ORDER — FENTANYL CITRATE (PF) 100 MCG/2ML IJ SOLN
INTRAMUSCULAR | Status: AC
  Filled 2022-08-10: qty 2

## 2022-08-10 MED ORDER — CLOPIDOGREL BISULFATE 75 MG PO TABS
ORAL_TABLET | ORAL | Status: DC | PRN
  Administered 2022-08-10: 300 mg via ORAL

## 2022-08-10 MED ORDER — ONDANSETRON HCL 4 MG/2ML IJ SOLN
INTRAMUSCULAR | Status: AC
  Filled 2022-08-10: qty 2

## 2022-08-10 MED ORDER — HEPARIN (PORCINE) IN NACL 1000-0.9 UT/500ML-% IV SOLN
INTRAVENOUS | Status: DC | PRN
  Administered 2022-08-10 (×2): 500 mL

## 2022-08-10 MED ORDER — HEPARIN SODIUM (PORCINE) 1000 UNIT/ML IJ SOLN
INTRAMUSCULAR | Status: AC
  Filled 2022-08-10: qty 10

## 2022-08-10 SURGICAL SUPPLY — 17 items
BALLN EUPHORA RX 2.0X12 (BALLOONS) ×1
BALLOON EUPHORA RX 2.0X12 (BALLOONS) IMPLANT
BAND ZEPHYR COMPRESS 30 LONG (HEMOSTASIS) IMPLANT
CATH INFINITI 5FR JK (CATHETERS) IMPLANT
CATH LAUNCHER 6FR EBU3.5 (CATHETERS) IMPLANT
DRAPE BRACHIAL (DRAPES) IMPLANT
GLIDESHEATH SLEND SS 6F .021 (SHEATH) IMPLANT
GUIDEWIRE INQWIRE 1.5J.035X260 (WIRE) IMPLANT
INQWIRE 1.5J .035X260CM (WIRE) ×1
KIT ENCORE 26 ADVANTAGE (KITS) IMPLANT
KIT SYRINGE INJ CVI SPIKEX1 (MISCELLANEOUS) IMPLANT
PACK CARDIAC CATH (CUSTOM PROCEDURE TRAY) ×1 IMPLANT
PROTECTION STATION PRESSURIZED (MISCELLANEOUS) ×1
SET ATX SIMPLICITY (MISCELLANEOUS) IMPLANT
STATION PROTECTION PRESSURIZED (MISCELLANEOUS) IMPLANT
TUBING CIL FLEX 10 FLL-RA (TUBING) IMPLANT
WIRE RUNTHROUGH .014X180CM (WIRE) IMPLANT

## 2022-08-10 NOTE — Consult Note (Signed)
Cardiology Consultation   Patient ID: Steven Kirk MRN: [id number]; DOB: 21-Sep-1976  Admit date: 08/10/2022 Date of Consult: 08/10/2022  PCP:  Oneita Hurt, No   [facility] Providers Cardiologist:  None        Patient Profile:   Steven Kirk is a 46 y.o. male with a hx of tobacco use who is being seen 08/10/2022 for the evaluation of inferior STEMI at the request of Dr. Derrill Kay.  History of Present Illness:   [name] is a 46 year old male with no prior cardiac history.  He has known history of tobacco and marijuana use.  He does not take any medications.  He started having chest pain yesterday afternoon described as substernal tightness feeling with associated shortness of breath.  The pain subsided and resolved by the time he went to sleep.  He woke up without symptoms but then went to work and started having similar chest pain this afternoon but more intense.  The pain continued and thus he came to the ED for evaluation.  He had an EKG done which showed minor inferior ST elevation.  These changes were new compared to his previous EKG in 2019 and based on this a code STEMI was activated.  He was given 4000 units of unfractionated heparin and aspirin.   History reviewed. No pertinent past medical history.  Past Surgical History:  Procedure Laterality Date   KNEE SURGERY       Home Medications:  Prior to Admission medications   Medication Sig Start Date End Date Taking? Authorizing Provider  oxyCODONE-acetaminophen (PERCOCET) 5-325 MG tablet Take 2 tablets by mouth every 6 (six) hours as needed for severe pain. 08/07/22  Yes Loleta Rose, MD  docusate sodium (COLACE) 100 MG capsule Take 1 tablet once or twice daily as needed for constipation while taking narcotic pain medicine 08/07/22   Loleta Rose, MD    Inpatient Medications: Scheduled Meds:  [START ON 08/11/2022] aspirin  81 mg Oral Daily   atorvastatin  80 mg Oral Daily   [START ON 08/11/2022] carvedilol   6.25 mg Oral BID WC   [START ON 08/11/2022] Chlorhexidine Gluconate Cloth  6 each Topical Q0600   [START ON 08/11/2022] clopidogrel  75 mg Oral Q breakfast   [START ON 08/11/2022] enoxaparin (LOVENOX) injection  40 mg Subcutaneous Q24H   sodium chloride flush  3 mL Intravenous Q12H   Continuous Infusions:  sodium chloride     sodium chloride     PRN Meds: sodium chloride, acetaminophen, ondansetron (ZOFRAN) IV, sodium chloride flush  Allergies:   No Known Allergies  Social History:   Social History   Socioeconomic History   Marital status: Single    Spouse name: Not on file   Number of children: Not on file   Years of education: Not on file   Highest education level: Not on file  Occupational History   Not on file  Tobacco Use   Smoking status: Every Day    Types: Cigarettes   Smokeless tobacco: Never  Substance and Sexual Activity   Alcohol use: Not Currently   Drug use: Yes    Types: Marijuana   Sexual activity: Yes    Partners: Female    Birth control/protection: Condom  Other Topics Concern   Not on file  Social History Narrative   Not on file   Social Determinants of Health   Financial Resource Strain: Not on file  Food Insecurity: Not on file  Transportation Needs: Not on file  Physical Activity: Not on file  Stress: Not on file  Social Connections: Not on file  Intimate Partner Violence: Not on file    Family History:   History reviewed. No pertinent family history.   ROS:  Please see the history of present illness.   All other ROS reviewed and negative.     Physical Exam/Data:   Vitals:   08/10/22 1853 08/10/22 1858 08/10/22 1903 08/10/22 1918  BP:  (!) 169/100    Pulse:   (!) 0 (!) 55  Resp:    17  Temp:    98.6 F (37 C)  TempSrc:    Oral  SpO2: 100%   100%  Weight:      Height:       No intake or output data in the 24 hours ending 08/10/22 1927    08/10/2022    5:21 PM 08/06/2022   10:56 PM 10/05/2021    7:14 AM  Last 3 Weights   Weight (lbs) 170 lb 200 lb 9.9 oz 190 lb  Weight (kg) 77.111 kg 91 kg 86.183 kg     Body mass index is 24.39 kg/m.  General:  Well nourished, well developed, in no acute distress HEENT: normal Neck: no JVD Vascular: No carotid bruits; Distal pulses 2+ bilaterally Cardiac:  normal S1, S2; RRR; no murmur  Lungs:  clear to auscultation bilaterally, no wheezing, rhonchi or rales  Abd: soft, nontender, no hepatomegaly  Ext: no edema Musculoskeletal:  No deformities, BUE and BLE strength normal and equal Skin: warm and dry  Neuro:  CNs 2-12 intact, no focal abnormalities noted Psych:  Normal affect   EKG:  The EKG was personally reviewed and demonstrates: Normal sinus rhythm with 1 mm of inferior ST elevation with reciprocal ST depression in the lateral leads. Telemetry:  Telemetry was personally reviewed and demonstrates:)  Relevant CV Studies:   Laboratory Data:  High Sensitivity Troponin:   Recent Labs  Lab 08/10/22 1728  TROPONINIHS 30*     Chemistry Recent Labs  Lab 08/10/22 1728  NA 138  K 3.7  CL 102  CO2 24  GLUCOSE 154*  BUN 13  CREATININE 1.29*  CALCIUM 9.5  GFRNONAA >60  ANIONGAP 12    No results for input(s): "PROT", "ALBUMIN", "AST", "ALT", "ALKPHOS", "BILITOT" in the last 168 hours. Lipids No results for input(s): "CHOL", "TRIG", "HDL", "LABVLDL", "LDLCALC", "CHOLHDL" in the last 168 hours.  Hematology Recent Labs  Lab 08/10/22 1728  WBC 9.7  RBC 4.90  HGB 14.8  HCT 43.9  MCV 89.6  MCH 30.2  MCHC 33.7  RDW 11.9  PLT 211   Thyroid No results for input(s): "TSH", "FREET4" in the last 168 hours.  BNPNo results for input(s): "BNP", "PROBNP" in the last 168 hours.  DDimer No results for input(s): "DDIMER" in the last 168 hours.   Radiology/Studies:  CARDIAC CATHETERIZATION  Result Date: 08/10/2022   Ramus lesion is 30% stenosed.   Lat 1st Mrg lesion is 100% stenosed.   Balloon angioplasty was performed using a BALLN EUPHORA RX 2.0X12.   Post  intervention, there is a 100% residual stenosis.   The left ventricular systolic function is normal.   LV end diastolic pressure is mildly elevated.   The left ventricular ejection fraction is 50-55% by visual estimate. 1.  Significant one-vessel coronary artery disease with occluded inferior branch of OM1 which is a likely culprit for myocardial infarction.  No other obstructive disease. 2.  Low normal LV systolic function  and mildly elevated left ventricular end-diastolic pressure. 3.  Successful attempted angioplasty of the inferior branch of OM 1.  It was clear after establishing flow that the branch was too small and the location was too distal to do any further ballooning or stenting.  There was still TIMI 0 flow in that location at the end of procedure. Recommendations: The antiplatelet therapy for at least 1 year. Aggressive treatment of risk factors and smoking cessation.   DG Chest Port 1 View  Result Date: 08/10/2022 CLINICAL DATA:  Chest pain EXAM: PORTABLE CHEST 1 VIEW COMPARISON:  11/21/2018 FINDINGS: The heart size and mediastinal contours are within normal limits. Both lungs are clear. The visualized skeletal structures are unremarkable. IMPRESSION: No active disease. Electronically Signed   By: Duanne Guess D.O.   On: 08/10/2022 17:47   CT Hip Right Wo Contrast  Result Date: 08/07/2022 CLINICAL DATA:  Trauma to the right hip. EXAM: CT OF THE RIGHT HIP WITHOUT CONTRAST TECHNIQUE: Multidetector CT imaging of the right hip was performed according to the standard protocol. Multiplanar CT image reconstructions were also generated. RADIATION DOSE REDUCTION: This exam was performed according to the departmental dose-optimization program which includes automated exposure control, adjustment of the mA and/or kV according to patient size and/or use of iterative reconstruction technique. COMPARISON:  Right hip radiograph dated 08/06/2022. FINDINGS: Bones/Joint/Cartilage There is no acute fracture  or dislocation. The bones are well mineralized. There is moderate arthritic changes of the right hip with joint space narrowing and subcortical cystic changes. No joint effusion. Ligaments Suboptimally assessed by CT. Muscles and Tendons No acute findings. Soft tissues No acute findings. IMPRESSION: 1. No acute fracture or dislocation. 2. 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 Electronically Signed   By: Elgie Collard M.D.   On: 08/07/2022 03:07   DG Hip Unilat W or Wo Pelvis 2-3 Views Right  Result Date: 08/06/2022 CLINICAL DATA:  Right hip pain, fall EXAM: DG HIP (WITH OR WITHOUT PELVIS) 2-3V RIGHT COMPARISON:  None Available. FINDINGS: Linear area of sclerosis noted within the right femoral neck. Slight cortical irregularity noted laterally. Consider further evaluation with CT to exclude fracture. Mild degenerative changes in the hips bilaterally. SI joints symmetric and unremarkable. IMPRESSION: Area of linear sclerosis in the right femoral neck and lateral cortical irregularity. Consider further evaluation with CT to exclude femoral neck fracture. Electronically Signed   By: Charlett Nose M.D.   On: 08/06/2022 23:20     Assessment and Plan:   Acute inferior ST elevation myocardial infarction: Borderline EKG changes but the story is consistent with myocardial infarction.  Thus, I recommended proceeding with emergent cardiac catheterization possible PCI.  I discussed the procedure in details as well as risk and benefits.  Cardiac catheterization was done via the right radial artery which showed an occluded inferior branch of OM1 which is the likely culprit.  No other obstructive disease.  Balloon angioplasty was performed but after this it was obvious that the branch was too small and the occlusion was too distal to do any further ballooning or stenting.  Recommend medical therapy with aspirin and clopidogrel.  I requested an echocardiogram.  I started high-dose statin. Hyperlipidemia: I started  high-dose atorvastatin. Tobacco use: I discussed the importance of smoking cessation. Marijuana use: I requested urine drug screen. Elevated blood pressure: The patient likely has undiagnosed hypertension.  I started small dose carvedilol.    Risk Assessment/Risk Scores:     TIMI Risk Score for ST  Elevation  MI:   The patient's TIMI risk score is 0, which indicates a 0.8% risk of all cause mortality at 30 days.{    For questions or updates, please contact [facility] Please consult [email or web address] for contact info under    Signed, Lorine Bears, MD  08/10/2022 7:27 PM

## 2022-08-10 NOTE — H&P (Signed)
History and Physical    Steven Kirk YWV:[id number] DOB: May 11, 1976 DOA: 08/10/2022  PCP: Pcp, No  Patient coming from: home  I have personally briefly reviewed patient's old medical records in [facility]  Chief Complaint: chest pain x 24 hours  HPI: Steven Kirk is a 46 y.o. male with no significant medical history, does have social history significant for tobacco abuse who notes family history of heart disease presents with chest pain described as severe pressure like pain that started the day prior but resolved and returned today while at work.  Patient states he also noted associated sob and diaphoresis as well. Pain noted to be 10/10.  ED Course:  Patient on evaluation found to have st elevation in inferior leads  Ce elevated 538 from 30. Patient discussed with cardiology and taken to cath labs.  Cath results; 1.  Significant one-vessel coronary artery disease with occluded inferior branch of OM1 which is a likely culprit for myocardial infarction.  No other obstructive disease. 2.  Low normal LV systolic function and mildly elevated left ventricular end-diastolic pressure. 3.  Successful attempted angioplasty of the inferior branch of OM 1.  It was clear after establishing flow that the branch was too small and the location was too distal to do any further ballooning or stenting.  There was still TIMI 0 flow in that location at the end of procedure.   Recommendations: The antiplatelet therapy for at least 1 year. Aggressive treatment of risk factors and smoking cessation.   Vitals  168/94, hr 83, rr 14, sat 100%  EKG: snr , st elevation inferior leads  Labs Wbc; 9.7, hgb 14.8, plt 211 Na 138, K 3.7, cL102, glu 154, cr 1.29 (1.19)  CE 30, 538  Review of Systems: As per HPI otherwise 10 point review of systems negative.   History reviewed. No pertinent past medical history.  Past Surgical History:  Procedure Laterality Date   KNEE SURGERY       reports  that he has been smoking cigarettes. He has never used smokeless tobacco. He reports that he does not currently use alcohol. He reports current drug use. Drug: Marijuana.  No Known Allergies  FH Mom-CVA  Prior to Admission medications   Medication Sig Start Date End Date Taking? Authorizing Provider  oxyCODONE-acetaminophen (PERCOCET) 5-325 MG tablet Take 2 tablets by mouth every 6 (six) hours as needed for severe pain. 08/07/22  Yes Loleta Rose, MD  docusate sodium (COLACE) 100 MG capsule Take 1 tablet once or twice daily as needed for constipation while taking narcotic pain medicine 08/07/22   Loleta Rose, MD    Physical Exam: Vitals:   08/10/22 2030 08/10/22 2045 08/10/22 2100 08/10/22 2115  BP: (!) 169/92 (!) 179/95 (!) 168/100 (!) 156/81  Pulse: (!) 58 (!) 55 62 (!) 58  Resp: 14 14 (!) 21 15  Temp:      TempSrc:      SpO2: 100% 100% 94% 100%  Weight:      Height:         Vitals:   08/10/22 2030 08/10/22 2045 08/10/22 2100 08/10/22 2115  BP: (!) 169/92 (!) 179/95 (!) 168/100 (!) 156/81  Pulse: (!) 58 (!) 55 62 (!) 58  Resp: 14 14 (!) 21 15  Temp:      TempSrc:      SpO2: 100% 100% 94% 100%  Weight:      Height:      Constitutional: NAD, calm, comfortable Eyes: PERRL,  lids and conjunctivae normal ENMT: Mucous membranes are moist. Posterior pharynx clear of any exudate or lesions.Normal dentition.  Neck: normal, supple, no masses, no thyromegaly Respiratory: clear to auscultation bilaterally, no wheezing, no crackles. Normal respiratory effort. No accessory muscle use.  Cardiovascular: Regular rate and rhythm, no murmurs / rubs / gallops. No extremity edema. 2+ pedal pulses. Abdomen: no tenderness, no masses palpated. No hepatosplenomegaly. Bowel sounds positive.  Musculoskeletal: no clubbing / cyanosis. No joint deformity upper and lower extremities. Good ROM, no contractures. Normal muscle tone.  Skin: no rashes, lesions, ulcers. No induration Neurologic: CN 2-12  grossly intact. Sensation intact,. Strength 5/5 in all 4.  Psychiatric: Normal judgment and insight. Alert and oriented x 3. Normal mood.    Labs on Admission: I have personally reviewed following labs and imaging studies  CBC: Recent Labs  Lab 08/10/22 1728  WBC 9.7  HGB 14.8  HCT 43.9  MCV 89.6  PLT 211   Basic Metabolic Panel: Recent Labs  Lab 08/10/22 1728  NA 138  K 3.7  CL 102  CO2 24  GLUCOSE 154*  BUN 13  CREATININE 1.29*  CALCIUM 9.5   GFR: Estimated Creatinine Clearance: 72.3 mL/min (A) (by C-G formula based on SCr of 1.29 mg/dL (H)). Liver Function Tests: No results for input(s): "AST", "ALT", "ALKPHOS", "BILITOT", "PROT", "ALBUMIN" in the last 168 hours. No results for input(s): "LIPASE", "AMYLASE" in the last 168 hours. No results for input(s): "AMMONIA" in the last 168 hours. Coagulation Profile: No results for input(s): "INR", "PROTIME" in the last 168 hours. Cardiac Enzymes: No results for input(s): "CKTOTAL", "CKMB", "CKMBINDEX", "TROPONINI" in the last 168 hours. BNP (last 3 results) No results for input(s): "PROBNP" in the last 8760 hours. HbA1C: No results for input(s): "HGBA1C" in the last 72 hours. CBG: Recent Labs  Lab 08/10/22 1919  GLUCAP 125*   Lipid Profile: No results for input(s): "CHOL", "HDL", "LDLCALC", "TRIG", "CHOLHDL", "LDLDIRECT" in the last 72 hours. Thyroid Function Tests: No results for input(s): "TSH", "T4TOTAL", "FREET4", "T3FREE", "THYROIDAB" in the last 72 hours. Anemia Panel: No results for input(s): "VITAMINB12", "FOLATE", "FERRITIN", "TIBC", "IRON", "RETICCTPCT" in the last 72 hours. Urine analysis: No results found for: "COLORURINE", "APPEARANCEUR", "LABSPEC", "PHURINE", "GLUCOSEU", "HGBUR", "BILIRUBINUR", "KETONESUR", "PROTEINUR", "UROBILINOGEN", "NITRITE", "LEUKOCYTESUR"  Radiological Exams on Admission: CARDIAC CATHETERIZATION  Result Date: 08/10/2022   Ramus lesion is 30% stenosed.   Lat 1st Mrg lesion is  100% stenosed.   Balloon angioplasty was performed using a BALLN EUPHORA RX 2.0X12.   Post intervention, there is a 100% residual stenosis.   The left ventricular systolic function is normal.   LV end diastolic pressure is mildly elevated.   The left ventricular ejection fraction is 50-55% by visual estimate. 1.  Significant one-vessel coronary artery disease with occluded inferior branch of OM1 which is a likely culprit for myocardial infarction.  No other obstructive disease. 2.  Low normal LV systolic function and mildly elevated left ventricular end-diastolic pressure. 3.  Successful attempted angioplasty of the inferior branch of OM 1.  It was clear after establishing flow that the branch was too small and the location was too distal to do any further ballooning or stenting.  There was still TIMI 0 flow in that location at the end of procedure. Recommendations: The antiplatelet therapy for at least 1 year. Aggressive treatment of risk factors and smoking cessation.   DG Chest Port 1 View  Result Date: 08/10/2022 CLINICAL DATA:  Chest pain EXAM: PORTABLE CHEST 1  VIEW COMPARISON:  11/21/2018 FINDINGS: The heart size and mediastinal contours are within normal limits. Both lungs are clear. The visualized skeletal structures are unremarkable. IMPRESSION: No active disease. Electronically Signed   By: Duanne Guess D.O.   On: 08/10/2022 17:47    EKG: Independently reviewed. See above  Assessment/Plan   Acute STEMI  -s/p cath noting  occluded inferior branch of OM1 which is a likely culprit for myocardial infarction s/p balloon angioplasty  -admit to ICU overnight  - asa,plavix, statin,carvedilol  -f/u on echo and final cardiac recs in am   HTN -elevated blood pressure, undiagnosed HTN -started on low dose carvedilol per cardiology recs  HLD -continue on high dose statin  -f/u on lipid panel   Tobacco abuse -encourage cessation   Hx of DJD of hip  Hx of recent fall  -supportive care    DVT prophylaxis: heparin Code Status:full Family Communication: none at bedside Disposition Plan: patient  expected to be admitted greater than 2 midnights  Consults called: Cardiology Dr Kirke Corin Admission status: ICU   Lurline Del MD [facility]   If 7PM-7AM, please contact night-coverage [email or web address] Password [facility]  08/10/2022, 9:40 PM

## 2022-08-10 NOTE — Progress Notes (Signed)
Responded to code stemi. Patient being cared for by staff. Later family member arrived escorted them to the room. No additional need expressed.

## 2022-08-10 NOTE — ED Notes (Signed)
Code  stemi called 1727

## 2022-08-10 NOTE — ED Triage Notes (Signed)
Patient c/o all over crushing chest pain with headache and sob that started yesterday and has progressively gotten worse.

## 2022-08-10 NOTE — ED Provider Notes (Signed)
[facility] Provider Note    Event Date/Time   First MD Initiated Contact with Patient 08/10/22 1726     (approximate)   History   Chest Pain   HPI  Steven Kirk is a 46 y.o. male  who presents to the emergency department today because of concerns for chest pain.  Patient states that the chest pain started yesterday however while he was work today it became more severe.  Located in the center chest.  Described as pressure-like.  It has been associate by some shortness of breath.  Additionally did have some sweating.  Denies similar pain in the past.  Denies any history of cardiac disease.    Physical Exam   Triage Vital Signs: ED Triage Vitals [08/10/22 1721]  Enc Vitals Group     BP      Pulse      Resp      Temp      Temp src      SpO2      Weight 170 lb (77.1 kg)     Height 5\' 10"  (1.778 m)     Head Circumference      Peak Flow      Pain Score 10     Pain Loc      Pain Edu?      Excl. in GC?     Most recent vital signs: Vitals:   08/10/22 1740 08/10/22 1750  BP: (!) 149/84 (!) 162/88  Pulse: 86 71  Resp: (!) 21 15  SpO2: 100% 100%   General: Awake, alert, oriented. CV:  Good peripheral perfusion. Regular rate and rhythm. Resp:  Normal effort. Lungs clear. Abd:  No distention. Non tender.    ED Results / Procedures / Treatments   Labs (all labs ordered are listed, but only abnormal results are displayed) Labs Reviewed  BASIC METABOLIC PANEL - Abnormal; Notable for the following components:      Result Value   Glucose, Bld 154 (*)    Creatinine, Ser 1.29 (*)    All other components within normal limits  GLUCOSE, CAPILLARY - Abnormal; Notable for the following components:   Glucose-Capillary 125 (*)    All other components within normal limits  TROPONIN I (HIGH SENSITIVITY) - Abnormal; Notable for the following components:   Troponin I (High Sensitivity) 30 (*)    All other components within normal limits  MRSA NEXT  GEN BY PCR, NASAL  CBC  BASIC METABOLIC PANEL  CBC  LIPOPROTEIN A (LPA)  CBC  CREATININE, SERUM  URINE DRUG SCREEN, QUALITATIVE ([facility] ONLY)  TROPONIN I (HIGH SENSITIVITY)     EKG  I, 08/12/22, attending physician, personally viewed and interpreted this EKG  EKG Time: 1722 Rate: 79 Rhythm: normal sinus rhythm Axis: normal Intervals: qtc 417 QRS: narrow ST changes: st elevation II, II aVF, st depression V1, V2 Impression: STEMI    RADIOLOGY I independently interpreted and visualized the CXR. My interpretation: No pneumonia. Radiology interpretation: IMPRESSION:  No active disease.     PROCEDURES:  Critical Care performed: No  Procedures  CRITICAL CARE Performed by: 1723   Total critical care time: 20 minutes  Critical care time was exclusive of separately billable procedures and treating other patients.  Critical care was necessary to treat or prevent imminent or life-threatening deterioration.  Critical care was time spent personally by me on the following activities: development of treatment plan with patient and/or surrogate as well as nursing, discussions  with consultants, evaluation of patient's response to treatment, examination of patient, obtaining history from patient or surrogate, ordering and performing treatments and interventions, ordering and review of laboratory studies, ordering and review of radiographic studies, pulse oximetry and re-evaluation of patient's condition.   MEDICATIONS ORDERED IN ED: Medications - No data to display   IMPRESSION / MDM / ASSESSMENT AND PLAN / ED COURSE  I reviewed the triage vital signs and the nursing notes.                              Differential diagnosis includes, but is not limited to, STEMI, esophagitis, dissection.  Patient's presentation is most consistent with acute presentation with potential threat to life or bodily function.  Patient presented to the emergency department  today because of concerns for chest pain.  EKG was concerning for ST elevation MI code STEMI was called.  Patient was given aspirin and heparin bolus here in the emergency department.  Dr. Kary Kos with cardiology evaluated the patient and took him emergently to the catheterization lab.   FINAL CLINICAL IMPRESSION(S) / ED DIAGNOSES   Final diagnoses:  ST elevation myocardial infarction (STEMI), unspecified artery (HCC)    Note:  This document was prepared using Dragon voice recognition software and may include unintentional dictation errors.    Phineas Semen, MD 08/10/22 [phone]

## 2022-08-11 ENCOUNTER — Other Ambulatory Visit: Payer: Self-pay

## 2022-08-11 ENCOUNTER — Encounter: Payer: Self-pay | Admitting: Cardiovascular Disease

## 2022-08-11 ENCOUNTER — Inpatient Hospital Stay (HOSPITAL_COMMUNITY)
Admission: EM | Admit: 2022-08-11 | Discharge: 2022-08-11 | Disposition: A | Discharge status: Home / Self Care | Payer: BLUE CROSS/BLUE SHIELD | Source: Home / Self Care | Attending: Cardiovascular Disease | Admitting: Cardiovascular Disease

## 2022-08-11 DIAGNOSIS — I214 Non-ST elevation (NSTEMI) myocardial infarction: Secondary | ICD-10-CM

## 2022-08-11 DIAGNOSIS — E785 Hyperlipidemia, unspecified: Secondary | ICD-10-CM | POA: Diagnosis present

## 2022-08-11 DIAGNOSIS — F1290 Cannabis use, unspecified, uncomplicated: Secondary | ICD-10-CM | POA: Diagnosis present

## 2022-08-11 DIAGNOSIS — I2119 ST elevation (STEMI) myocardial infarction involving other coronary artery of inferior wall: Secondary | ICD-10-CM

## 2022-08-11 DIAGNOSIS — Z720 Tobacco use: Secondary | ICD-10-CM | POA: Diagnosis present

## 2022-08-11 DIAGNOSIS — I10 Essential (primary) hypertension: Secondary | ICD-10-CM | POA: Diagnosis present

## 2022-08-11 LAB — CBC
HCT: 41.1 % (ref 39.0–52.0)
Hemoglobin: 14 g/dL (ref 13.0–17.0)
MCH: 30.5 pg (ref 26.0–34.0)
MCHC: 34.1 g/dL (ref 30.0–36.0)
MCV: 89.5 fL (ref 80.0–100.0)
Platelets: 201 10*3/uL (ref 150–400)
RBC: 4.59 MIL/uL (ref 4.22–5.81)
RDW: 11.9 % (ref 11.5–15.5)
WBC: 11 10*3/uL — ABNORMAL HIGH (ref 4.0–10.5)
nRBC: 0 % (ref 0.0–0.2)

## 2022-08-11 LAB — BASIC METABOLIC PANEL
Anion gap: 7 (ref 5–15)
BUN: 12 mg/dL (ref 6–20)
CO2: 25 mmol/L (ref 22–32)
Calcium: 8.6 mg/dL — ABNORMAL LOW (ref 8.9–10.3)
Chloride: 106 mmol/L (ref 98–111)
Creatinine, Ser: 1.07 mg/dL (ref 0.61–1.24)
GFR, Estimated: >60 mL/min (ref >60)
Glucose, Bld: 114 mg/dL — ABNORMAL HIGH (ref 70–99)
Potassium: 3.9 mmol/L (ref 3.5–5.1)
Sodium: 138 mmol/L (ref 135–145)

## 2022-08-11 LAB — ECHOCARDIOGRAM COMPLETE
AR max vel: 2.99 cm2
AV Area VTI: 2.69 cm2
AV Area mean vel: 2.67 cm2
AV Mean grad: 4 mmHg
AV Peak grad: 7 mmHg
Ao pk vel: 1.32 m/s
Area-P 1/2: 2.85 cm2
Height: 70 in
S' Lateral: 3.64 cm
Weight: 2720 oz

## 2022-08-11 LAB — URINE DRUG SCREEN, QUALITATIVE (ARMC ONLY)
Amphetamines, Ur Screen: NOT DETECTED
Barbiturates, Ur Screen: NOT DETECTED
Benzodiazepine, Ur Scrn: POSITIVE — AB
Cannabinoid 50 Ng, Ur ~~LOC~~: POSITIVE — AB
Cocaine Metabolite,Ur ~~LOC~~: NOT DETECTED
MDMA (Ecstasy)Ur Screen: NOT DETECTED
Methadone Scn, Ur: NOT DETECTED
Opiate, Ur Screen: NOT DETECTED
Phencyclidine (PCP) Ur S: NOT DETECTED
Tricyclic, Ur Screen: NOT DETECTED

## 2022-08-11 LAB — TROPONIN I (HIGH SENSITIVITY): Troponin I (High Sensitivity): 21920 ng/L (ref <18)

## 2022-08-11 MED ORDER — LOSARTAN POTASSIUM 50 MG PO TABS
50.0000 mg | ORAL_TABLET | Freq: Every day | ORAL | Status: DC
  Administered 2022-08-11 – 2022-08-12 (×2): 50 mg via ORAL
  Filled 2022-08-11 (×2): qty 1

## 2022-08-11 MED ORDER — CARVEDILOL 3.125 MG PO TABS
3.1250 mg | ORAL_TABLET | Freq: Two times a day (BID) | ORAL | Status: DC
  Administered 2022-08-11 – 2022-08-12 (×2): 3.125 mg via ORAL
  Filled 2022-08-11 (×2): qty 1

## 2022-08-11 NOTE — Progress Notes (Signed)
Rounding Note    Patient Name: Steven Kirk Date of Encounter: 08/11/2022  [facility] Cardiologist: new Kirke Corin)  Subjective   He is feeling well with no chest pain or shortness of breath.  Carvedilol was not given due to bradycardia.  Inpatient Medications    Scheduled Meds:  aspirin  81 mg Oral Daily   atorvastatin  80 mg Oral Daily   carvedilol  3.125 mg Oral BID WC   Chlorhexidine Gluconate Cloth  6 each Topical Q0600   clopidogrel  75 mg Oral Q breakfast   enoxaparin (LOVENOX) injection  40 mg Subcutaneous Q24H   losartan  50 mg Oral Daily   sodium chloride flush  3 mL Intravenous Q12H   Continuous Infusions:  sodium chloride     PRN Meds: sodium chloride, acetaminophen, ondansetron (ZOFRAN) IV, sodium chloride flush   Vital Signs    Vitals:   08/11/22 0600 08/11/22 0700 08/11/22 0800 08/11/22 0900  BP: (!) 148/79 (!) 151/85 (!) 148/91 (!) 135/101  Pulse: (!) 57 (!) 47 (!) 50 (!) 56  Resp: 17 17 14 15   Temp:   97.8 F (36.6 C)   TempSrc:   Oral   SpO2: 99% 97% 100% 97%  Weight:      Height:        Intake/Output Summary (Last 24 hours) at 08/11/2022 1117 Last data filed at 08/11/2022 0800 Gross per 24 hour  Intake 1000 ml  Output 1800 ml  Net -800 ml      08/10/2022    5:21 PM 08/06/2022   10:56 PM 10/05/2021    7:14 AM  Last 3 Weights  Weight (lbs) 170 lb 200 lb 9.9 oz 190 lb  Weight (kg) 77.111 kg 91 kg 86.183 kg      Telemetry    Normal sinus rhythm with intermittent sinus bradycardia.  Short runs of nonsustained ventricular tachycardia- Personally Reviewed  ECG    Sinus bradycardia with less than 1 mm of ST elevation in lead III.  No Q waves.- Personally Reviewed  Physical Exam   GEN: No acute distress.   Neck: No JVD Cardiac: RRR, no murmurs, rubs, or gallops.  Respiratory: Clear to auscultation bilaterally. GI: Soft, nontender, non-distended  MS: No edema; No deformity. Neuro:  Nonfocal  Psych: Normal affect   Right radial pulses normal with no hematoma.  Labs    High Sensitivity Troponin:   Recent Labs  Lab 08/10/22 1728 08/10/22 1932 08/11/22 0550  TROPONINIHS 30* 538* 21,920*     Chemistry Recent Labs  Lab 08/10/22 1728 08/11/22 0327  NA 138 138  K 3.7 3.9  CL 102 106  CO2 24 25  GLUCOSE 154* 114*  BUN 13 12  CREATININE 1.29* 1.07  CALCIUM 9.5 8.6*  GFRNONAA >60 >60  ANIONGAP 12 7    Lipids No results for input(s): "CHOL", "TRIG", "HDL", "LABVLDL", "LDLCALC", "CHOLHDL" in the last 168 hours.  Hematology Recent Labs  Lab 08/10/22 1728 08/11/22 0327  WBC 9.7 11.0*  RBC 4.90 4.59  HGB 14.8 14.0  HCT 43.9 41.1  MCV 89.6 89.5  MCH 30.2 30.5  MCHC 33.7 34.1  RDW 11.9 11.9  PLT 211 201   Thyroid No results for input(s): "TSH", "FREET4" in the last 168 hours.  BNPNo results for input(s): "BNP", "PROBNP" in the last 168 hours.  DDimer No results for input(s): "DDIMER" in the last 168 hours.   Radiology    CARDIAC CATHETERIZATION  Result Date: 08/10/2022  Ramus lesion is 30% stenosed.   Lat 1st Mrg lesion is 100% stenosed.   Balloon angioplasty was performed using a BALLN EUPHORA RX 2.0X12.   Post intervention, there is a 100% residual stenosis.   The left ventricular systolic function is normal.   LV end diastolic pressure is mildly elevated.   The left ventricular ejection fraction is 50-55% by visual estimate. 1.  Significant one-vessel coronary artery disease with occluded inferior branch of OM1 which is a likely culprit for myocardial infarction.  No other obstructive disease. 2.  Low normal LV systolic function and mildly elevated left ventricular end-diastolic pressure. 3.  Successful attempted angioplasty of the inferior branch of OM 1.  It was clear after establishing flow that the branch was too small and the location was too distal to do any further ballooning or stenting.  There was still TIMI 0 flow in that location at the end of procedure. Recommendations: The  antiplatelet therapy for at least 1 year. Aggressive treatment of risk factors and smoking cessation.   DG Chest Port 1 View  Result Date: 08/10/2022 CLINICAL DATA:  Chest pain EXAM: PORTABLE CHEST 1 VIEW COMPARISON:  11/21/2018 FINDINGS: The heart size and mediastinal contours are within normal limits. Both lungs are clear. The visualized skeletal structures are unremarkable. IMPRESSION: No active disease. Electronically Signed   By: Duanne Guess D.O.   On: 08/10/2022 17:47    Cardiac Studies   Echocardiogram is pending  Patient Profile     46 y.o. male with history of tobacco use who presented with inferior ST elevation myocardial infarction yesterday.  Assessment & Plan    1.  Inferior ST elevation myocardial infarction: Emergent cardiac catheterization was done and showed an occluded inferior branch of OM1 which overall was small.  The occlusion was also distal.  Balloon angioplasty was performed but no stent was placed.  Fortunately, the patient is pain-free at the present time. Recommend dual antiplatelet therapy with aspirin and clopidogrel for 1 year. Recommend aggressive treatment of risk factors. The patient can be transferred to telemetry today and likely be discharged home tomorrow. He is to stay off work for 1 week. Echocardiogram is still pending.  2.  Hyperlipidemia: Continue high-dose atorvastatin.  3.  Tobacco use: I discussed importance of smoking cessation.  4.  Marijuana use: Urine drug screen showed no other associated drugs.  5.  Hypertension: This is likely a chronic diagnosis but he has not been on medications.  I decrease carvedilol to 3.125 mg twice daily due to bradycardia.  I elected to add losartan 50 mg once daily.  6.  Disposition: I wrote an order to transfer to telemetry today and possible discharge home tomorrow if he remains stable.  Total encounter time more than 50 minutes. Greater than 50% was spent in counseling and coordination of care  with the patient.      For questions or updates, please contact [facility] Please consult [email or web address] for contact info under        Signed, Lorine Bears, MD  08/11/2022, 11:17 AM

## 2022-08-11 NOTE — Assessment & Plan Note (Signed)
Presented as outlined above and taken emergently to Cath Lab and underwent.  Balloon angioplasty to inferior branch of OM1 on 8/27.  Outpatient follow-up with cardiology in the next 2 weeks.  DAPT with aspirin and Plavix for 1 year.  Started on Coreg, Lipitor, losartan.  Echocardiogram done 8/28 unremarkable with preserved ejection fraction no evidence of diastolic dysfunction.

## 2022-08-11 NOTE — Hospital Course (Signed)
Steven Kirk is a 46 y.o. male with no significant medical history, does have social history significant for tobacco abuse who notes family history of heart disease presents with chest pain described as severe pressure like pain that started the day prior but resolved and returned while at work, 10/10 in severity with associated dyspnea and diaphoresis.   ED evaluation consistent with non-STEMI as troponin was elevated at initially 538, repeat was almost 22,000.  EKG showed ST elevation in the inferior leads.  Patient was taken emergently to the Cath Lab with cardiology where balloon angioplasty was performed to an inferior branch of OM1, no stent was able to be placed.  Patient has been chest pain-free since intervention. Started on dual antiplatelet therapies to be continued for 1 year.  Monitoring closely, awaiting echocardiogram and clearance by cardiology.  Anticipate discharge tomorrow if stable.

## 2022-08-11 NOTE — Assessment & Plan Note (Signed)
Counseled regarding the importance of smoking cessation.

## 2022-08-11 NOTE — Assessment & Plan Note (Signed)
Likely chronic diagnosis, not on antihypertensives prior to admission.  Started on losartan 50 mg daily, Coreg started, but dose decreased to 3.125 mg twice daily after some bradycardia which has since resolved.

## 2022-08-11 NOTE — Assessment & Plan Note (Signed)
Started on high intensity Lipitor 80 mg

## 2022-08-11 NOTE — Assessment & Plan Note (Signed)
Noted on UDS, negative for any other substances.

## 2022-08-11 NOTE — Progress Notes (Signed)
  Progress Note   Patient: Steven Kirk BWI:[id number] DOB: April 10, 1976 DOA: 08/10/2022     1 DOS: the patient was seen and examined on 08/11/2022   Brief hospital course: Steven Kirk is a 46 y.o. male with no significant medical history, does have social history significant for tobacco abuse who notes family history of heart disease presents with chest pain described as severe pressure like pain that started the day prior but resolved and returned while at work, 10/10 in severity with associated dyspnea and diaphoresis.   ED evaluation consistent with non-STEMI as troponin was elevated at initially 538, repeat was almost 22,000.  EKG showed ST elevation in the inferior leads.  Patient was taken emergently to the Cath Lab with cardiology where balloon angioplasty was performed to an inferior branch of OM1, no stent was able to be placed.  Patient has been chest pain-free since intervention. Started on dual antiplatelet therapies to be continued for 1 year.  Monitoring closely, awaiting echocardiogram and clearance by cardiology.  Anticipate discharge tomorrow if stable.  Assessment and Plan: * Acute non-ST elevation myocardial infarction (NSTEMI) of inferior wall [facility]) Presented as outlined above and taken emergently to Cath Lab and underwent.  Balloon angioplasty to inferior branch of OM1 on 8/27.  Has been chest pain-free since intervention. -- Cardiology following -- DAPT with aspirin and Plavix for 1 year -- Started on Coreg, Lipitor, losartan -- Follow-up pending echo  Primary hypertension Likely chronic diagnosis, not on antihypertensives prior to admission.  Started on losartan 50 mg daily, Coreg 3.125 mg twice daily.  Hyperlipidemia Started on high intensity Lipitor 80 mg  Tobacco use Counseled regarding the importance of smoking cessation.  Marijuana use Noted on UDS, negative for any other substances.        Subjective: Patient awake sitting up in bed when  seen on rounds this morning.  He denies any chest pain or other acute complaints today.  Physical Exam: Vitals:   08/11/22 1300 08/11/22 1400 08/11/22 1500 08/11/22 1615  BP: 137/87 (!) 140/84 138/88 132/79  Pulse: 61 (!) 55 (!) 59 (!) 57  Resp: (!) 21 15 20 12   Temp:    98.5 F (36.9 C)  TempSrc:    Oral  SpO2: 100% 100% 100% 100%  Weight:      Height:       General exam: awake, alert, no acute distress HEENT: atraumatic, clear conjunctiva, anicteric sclera, moist mucus membranes, hearing grossly normal  Respiratory system: CTAB, no wheezes, rales or rhonchi, normal respiratory effort. Cardiovascular system: normal S1/S2, RRR, no JVD, murmurs, rubs, gallops, no pedal edema.   Gastrointestinal system: soft, NT, ND, no HSM felt, +bowel sounds. Central nervous system: A&O x4. no gross focal neurologic deficits, normal speech Extremities: Right radial access site without any surrounding tenderness or swelling, no edema, normal tone Skin: dry, intact, normal temperature, normal color, No rashes, lesions or ulcers Psychiatry: normal mood, congruent affect, judgement and insight appear normal    Data Reviewed:  Notable labs: WBC 11.0, troponin peaked at 21,920  Family Communication: None  Disposition: Status is: Inpatient Remains inpatient appropriate because: Closely monitoring and awaiting echo and cardiology clearance for discharge likely tomorrow    Planned Discharge Destination: Home    Time spent: 40 minutes  Author: , DO 08/11/2022 4:30 PM  For on call review [email or web address].

## 2022-08-12 ENCOUNTER — Telehealth: Payer: Self-pay

## 2022-08-12 DIAGNOSIS — I10 Essential (primary) hypertension: Secondary | ICD-10-CM | POA: Diagnosis not present

## 2022-08-12 DIAGNOSIS — E785 Hyperlipidemia, unspecified: Secondary | ICD-10-CM

## 2022-08-12 DIAGNOSIS — I214 Non-ST elevation (NSTEMI) myocardial infarction: Secondary | ICD-10-CM | POA: Diagnosis not present

## 2022-08-12 DIAGNOSIS — N179 Acute kidney failure, unspecified: Secondary | ICD-10-CM | POA: Diagnosis not present

## 2022-08-12 LAB — CBC
HCT: 42.6 % (ref 39.0–52.0)
Hemoglobin: 14.4 g/dL (ref 13.0–17.0)
MCH: 30.6 pg (ref 26.0–34.0)
MCHC: 33.8 g/dL (ref 30.0–36.0)
MCV: 90.6 fL (ref 80.0–100.0)
Platelets: 209 10*3/uL (ref 150–400)
RBC: 4.7 MIL/uL (ref 4.22–5.81)
RDW: 11.8 % (ref 11.5–15.5)
WBC: 8.9 10*3/uL (ref 4.0–10.5)
nRBC: 0 % (ref 0.0–0.2)

## 2022-08-12 LAB — BASIC METABOLIC PANEL
Anion gap: 5 (ref 5–15)
BUN: 11 mg/dL (ref 6–20)
CO2: 25 mmol/L (ref 22–32)
Calcium: 9.1 mg/dL (ref 8.9–10.3)
Chloride: 109 mmol/L (ref 98–111)
Creatinine, Ser: 1.11 mg/dL (ref 0.61–1.24)
GFR, Estimated: >60 mL/min (ref >60)
Glucose, Bld: 106 mg/dL — ABNORMAL HIGH (ref 70–99)
Potassium: 4.1 mmol/L (ref 3.5–5.1)
Sodium: 139 mmol/L (ref 135–145)

## 2022-08-12 LAB — HEMOGLOBIN A1C
Hgb A1c MFr Bld: 5.6 % (ref 4.8–5.6)
Mean Plasma Glucose: 114.02 mg/dL

## 2022-08-12 LAB — LIPOPROTEIN A (LPA): Lipoprotein (a): <8.4 nmol/L (ref <75.0)

## 2022-08-12 MED ORDER — ATORVASTATIN CALCIUM 80 MG PO TABS: 80.0000 mg | ORAL_TABLET | Freq: Every day | ORAL | 1 refills | Status: DC

## 2022-08-12 MED ORDER — ASPIRIN 81 MG PO CHEW: 81.0000 mg | CHEWABLE_TABLET | Freq: Every day | ORAL | 11 refills | Status: DC

## 2022-08-12 MED ORDER — CLOPIDOGREL BISULFATE 75 MG PO TABS: 75.0000 mg | ORAL_TABLET | Freq: Every day | ORAL | 11 refills | Status: DC

## 2022-08-12 MED ORDER — LOSARTAN POTASSIUM 50 MG PO TABS: 50.0000 mg | ORAL_TABLET | Freq: Every day | ORAL | 11 refills | Status: DC

## 2022-08-12 MED ORDER — CARVEDILOL 3.125 MG PO TABS: 3.1250 mg | ORAL_TABLET | Freq: Two times a day (BID) | ORAL | 11 refills | Status: DC

## 2022-08-12 NOTE — Assessment & Plan Note (Signed)
Under mission, creatinine of 1.29, elevated from baseline.  By day of discharge, down to baseline of 1.11 with GFR greater than 60.

## 2022-08-12 NOTE — Discharge Summary (Addendum)
Physician Discharge Summary   Patient: Steven Kirk MRN: [id number] DOB: 03-Dec-1976  Admit date:     08/10/2022  Discharge date: 08/22/22  Discharge Physician: Hollice Espy   PCP: Pcp, No   Recommendations at discharge:   Patient will follow-up with cardiology in the next 2 weeks.  They will call him to set up this appointment New medication: Lipitor 80 mg p.o. daily New medication: Cozaar 50 mg p.o. daily New medication: Coreg 3.125 mg p.o. twice daily New medication: Aspirin 81 mg p.o. daily x1 year New medication: Plavix 75 mg p.o. daily x1 year  Discharge Diagnoses: Principal Problem:   Acute ST elevation myocardial infarction (STEMI) of inferior wall (HCC) Active Problems:   Primary hypertension   Hyperlipidemia   Tobacco use   Marijuana use  Resolved Problems:   AKI (acute kidney injury) [facility])  Hospital Course: 46 year old male with no significant past medical history other than tobacco abuse who presented to the emergency department on 8/27 with complaints of heavy chest pressure.  The symptoms had actually started the day prior and then resolved, but then returned on day of presentation while at work.  In the emergency department, patient found to have ST elevations in his inferior leads and cardiac enzymes initially at 30, but increased to 538, consistent with inferior MI.  Patient was seen by cardiology and taken for same-day cardiac catheterization which revealed significant one-vessel coronary artery disease with occluded inferior branch of OM1 with successful angioplasty of the inferior branch of OM1.  Recommendation was for antiplatelet therapy x1 year and treating risk factors.  Cleared by cardiology for discharge on 8/29.  Assessment and Plan: * Acute ST elevation myocardial infarction (STEMI) of inferior wall [facility]) Presented as outlined above and taken emergently to Cath Lab and underwent.  Balloon angioplasty to inferior branch of OM1 on 8/27.   Outpatient follow-up with cardiology in the next 2 weeks.  DAPT with aspirin and Plavix for 1 year.  Started on Coreg, Lipitor, losartan.  Echocardiogram done 8/28 unremarkable with preserved ejection fraction no evidence of diastolic dysfunction.  Primary hypertension Likely chronic diagnosis, not on antihypertensives prior to admission.  Started on losartan 50 mg daily, Coreg started, but dose decreased to 3.125 mg twice daily after some bradycardia which has since resolved.  Hyperlipidemia Started on high intensity Lipitor 80 mg  Tobacco use Counseled regarding the importance of smoking cessation.  AKI (acute kidney injury) (HCC)-resolved as of 08/12/2022 Under mission, creatinine of 1.29, elevated from baseline.  By day of discharge, down to baseline of 1.11 with GFR greater than 60.  Marijuana use Noted on UDS, negative for any other substances.         Consultants: Cardiology Procedures performed:  -Echocardiogram noting preserved ejection fraction, no evidence of diastolic dysfunction -Cardiac catheterization status post angioplasty of branch of OM1 Disposition: Home Diet recommendation:  Discharge Diet Orders (From admission, onward)     Start     Ordered   08/12/22 0000  Diet - low sodium heart healthy        08/12/22 1050           Cardiac diet DISCHARGE MEDICATION: Allergies as of 08/12/2022   No Known Allergies      Medication List     TAKE these medications    aspirin 81 MG chewable tablet Chew 1 tablet (81 mg total) by mouth daily.   atorvastatin 80 MG tablet Commonly known as: LIPITOR Take 1 tablet (80 mg total) by  mouth daily.   carvedilol 3.125 MG tablet Commonly known as: COREG Take 1 tablet (3.125 mg total) by mouth 2 (two) times daily with a meal.   clopidogrel 75 MG tablet Commonly known as: PLAVIX Take 1 tablet (75 mg total) by mouth daily with breakfast.   docusate sodium 100 MG capsule Commonly known as: Colace Take 1 tablet  once or twice daily as needed for constipation while taking narcotic pain medicine   losartan 50 MG tablet Commonly known as: COZAAR Take 1 tablet (50 mg total) by mouth daily.   oxyCODONE-acetaminophen 5-325 MG tablet Commonly known as: Percocet Take 2 tablets by mouth every 6 (six) hours as needed for severe pain.        Follow-up Information     Kirk, Steven Deer, MD Follow up in 2 week(s).   Specialty: Cardiology Why: Office will call you in the next day or 2 to set up an appointment to be seen in the next 2 weeks.  Call them if you have not heard from them by the Kirk of the day Wednesday. Contact information: [address] [place] [address] [phone]                Discharge Exam: Steven Kirk Weights   08/10/22 1721  Weight: 77.1 kg   General: Alert and oriented x3, no acute distress Cardiovascular: Regular rate and rhythm, S1-S2 Lungs: Clear to auscultation bilaterally  Condition at discharge: good  The results of significant diagnostics from this hospitalization (including imaging, microbiology, ancillary and laboratory) are listed below for reference.   Imaging Studies: ECHOCARDIOGRAM COMPLETE  Result Date: 08/11/2022    ECHOCARDIOGRAM REPORT   Patient Name:   Steven Kirk Date of Exam: 08/11/2022 Medical Rec #:  [id number]          Height:       70.0 in Accession #:    [id number]         Weight:       170.0 lb Date of Birth:  [birth year]          BSA:          1.948 m Patient Age:    46 years           BP:           155/78 mmHg Patient Gender: M                  HR:           50 bpm. Exam Location:  [facility] Procedure: 2D Echo, Cardiac Doppler and Color Doppler Indications:     I21.9 Acute myocardial infarction  History:         Patient has no prior history of Echocardiogram examinations.                  Angioplasty; Risk Factors:Current Smoker.  Sonographer:     Steven Kirk Referring Phys:  [id number] A ARIDA Diagnosing Phys: Steven Bears  MD  Sonographer Comments: Image acquisition challenging due to respiratory motion. IMPRESSIONS  1. Left ventricular ejection fraction, by estimation, is 55 to 60%. The left ventricle has normal function. The left ventricle has no regional wall motion abnormalities. There is mild left ventricular hypertrophy. Left ventricular diastolic parameters were normal.  2. Right ventricular systolic function is normal. The right ventricular size is normal. Tricuspid regurgitation signal is inadequate for assessing PA pressure.  3. The mitral valve is normal in structure. No evidence of mitral  valve regurgitation. No evidence of mitral stenosis.  4. The aortic valve is normal in structure. Aortic valve regurgitation is not visualized. No aortic stenosis is present.  5. The inferior vena cava is normal in size with greater than 50% respiratory variability, suggesting right atrial pressure of 3 mmHg. FINDINGS  Left Ventricle: Left ventricular ejection fraction, by estimation, is 55 to 60%. The left ventricle has normal function. The left ventricle has no regional wall motion abnormalities. The left ventricular internal cavity size was normal in size. There is  mild left ventricular hypertrophy. Left ventricular diastolic parameters were normal. Right Ventricle: The right ventricular size is normal. No increase in right ventricular wall thickness. Right ventricular systolic function is normal. Tricuspid regurgitation signal is inadequate for assessing PA pressure. Left Atrium: Left atrial size was normal in size. Right Atrium: Right atrial size was normal in size. Pericardium: There is no evidence of pericardial effusion. Mitral Valve: The mitral valve is normal in structure. No evidence of mitral valve regurgitation. No evidence of mitral valve stenosis. Tricuspid Valve: The tricuspid valve is normal in structure. Tricuspid valve regurgitation is trivial. No evidence of tricuspid stenosis. Aortic Valve: The aortic valve is normal  in structure. Aortic valve regurgitation is not visualized. No aortic stenosis is present. Aortic valve mean gradient measures 4.0 mmHg. Aortic valve peak gradient measures 7.0 mmHg. Aortic valve area, by VTI measures 2.69 cm. Pulmonic Valve: The pulmonic valve was normal in structure. Pulmonic valve regurgitation is not visualized. No evidence of pulmonic stenosis. Aorta: The aortic root is normal in size and structure. Venous: The inferior vena cava is normal in size with greater than 50% respiratory variability, suggesting right atrial pressure of 3 mmHg. IAS/Shunts: No atrial level shunt detected by color flow Doppler.  LEFT VENTRICLE PLAX 2D LVIDd:         4.90 cm   Diastology LVIDs:         3.64 cm   LV e' medial:    8.70 cm/s LV PW:         1.23 cm   LV E/e' medial:  10.4 LV IVS:        1.13 cm   LV e' lateral:   11.90 cm/s LVOT diam:     2.10 cm   LV E/e' lateral: 7.6 LV SV:         78 LV SV Index:   40 LVOT Area:     3.46 cm  RIGHT VENTRICLE RV Basal diam:  3.41 cm RV S prime:     14.80 cm/s TAPSE (M-mode): 3.5 cm LEFT ATRIUM             Index        RIGHT ATRIUM           Index LA diam:        3.40 cm 1.75 cm/m   RA Area:     18.80 cm LA Vol (A2C):   67.4 ml 34.60 ml/m  RA Volume:   53.60 ml  27.52 ml/m LA Vol (A4C):   38.5 ml 19.76 ml/m LA Biplane Vol: 53.3 ml 27.36 ml/m  AORTIC VALVE AV Area (Vmax):    2.99 cm AV Area (Vmean):   2.67 cm AV Area (VTI):     2.69 cm AV Vmax:           132.00 cm/s AV Vmean:          96.300 cm/s AV VTI:  0.290 m AV Peak Grad:      7.0 mmHg AV Mean Grad:      4.0 mmHg LVOT Vmax:         114.00 cm/s LVOT Vmean:        74.100 cm/s LVOT VTI:          0.225 m LVOT/AV VTI ratio: 0.78  AORTA Ao Root diam: 3.60 cm MITRAL VALVE MV Area (PHT): 2.85 cm    SHUNTS MV Decel Time: 266 msec    Systemic VTI:  0.22 m MV E velocity: 90.40 cm/s  Systemic Diam: 2.10 cm MV A velocity: 67.70 cm/s MV E/A ratio:  1.34 Steven Bears MD Electronically signed by Steven Bears MD  Signature Date/Time: 08/11/2022/4:51:52 PM    Final    CARDIAC CATHETERIZATION  Result Date: 08/10/2022   Ramus lesion is 30% stenosed.   Lat 1st Mrg lesion is 100% stenosed.   Balloon angioplasty was performed using a BALLN EUPHORA RX 2.0X12.   Post intervention, there is a 100% residual stenosis.   The left ventricular systolic function is normal.   LV Kirk diastolic pressure is mildly elevated.   The left ventricular ejection fraction is 50-55% by visual estimate. 1.  Significant one-vessel coronary artery disease with occluded inferior branch of OM1 which is a likely culprit for myocardial infarction.  No other obstructive disease. 2.  Low normal LV systolic function and mildly elevated left ventricular Kirk-diastolic pressure. 3.  Successful attempted angioplasty of the inferior branch of OM 1.  It was clear after establishing flow that the branch was too small and the location was too distal to do any further ballooning or stenting.  There was still TIMI 0 flow in that location at the Kirk of procedure. Recommendations: The antiplatelet therapy for at least 1 year. Aggressive treatment of risk factors and smoking cessation.   DG Chest Port 1 View  Result Date: 08/10/2022 CLINICAL DATA:  Chest pain EXAM: PORTABLE CHEST 1 VIEW COMPARISON:  11/21/2018 FINDINGS: The heart size and mediastinal contours are within normal limits. Both lungs are clear. The visualized skeletal structures are unremarkable. IMPRESSION: No active disease. Electronically Signed   By: Duanne Guess D.O.   On: 08/10/2022 17:47   CT Hip Right Wo Contrast  Result Date: 08/07/2022 CLINICAL DATA:  Trauma to the right hip. EXAM: CT OF THE RIGHT HIP WITHOUT CONTRAST TECHNIQUE: Multidetector CT imaging of the right hip was performed according to the standard protocol. Multiplanar CT image reconstructions were also generated. RADIATION DOSE REDUCTION: This exam was performed according to the departmental dose-optimization program which  includes automated exposure control, adjustment of the mA and/or kV according to patient size and/or use of iterative reconstruction technique. COMPARISON:  Right hip radiograph dated 08/06/2022. FINDINGS: Bones/Joint/Cartilage There is no acute fracture or dislocation. The bones are well mineralized. There is moderate arthritic changes of the right hip with joint space narrowing and subcortical cystic changes. No joint effusion. Ligaments Suboptimally assessed by CT. Muscles and Tendons No acute findings. Soft tissues No acute findings. IMPRESSION: 1. No acute fracture or dislocation. 2. 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 Electronically Signed   By: Elgie Collard M.D.   On: 08/07/2022 03:07   DG Hip Unilat W or Wo Pelvis 2-3 Views Right  Result Date: 08/06/2022 CLINICAL DATA:  Right hip pain, fall EXAM: DG HIP (WITH OR WITHOUT PELVIS) 2-3V RIGHT COMPARISON:  None Available. FINDINGS: Linear area of sclerosis noted within the right femoral neck. Slight cortical  irregularity noted laterally. Consider further evaluation with CT to exclude fracture. Mild degenerative changes in the hips bilaterally. SI joints symmetric and unremarkable. IMPRESSION: Area of linear sclerosis in the right femoral neck and lateral cortical irregularity. Consider further evaluation with CT to exclude femoral neck fracture. Electronically Signed   By: Charlett Nose M.D.   On: 08/06/2022 23:20    Microbiology: Results for orders placed or performed during the hospital encounter of 08/10/22  MRSA Next Gen by PCR, Nasal     Status: None   Collection Time: 08/10/22  8:55 PM   Specimen: Nasal Mucosa; Nasal Swab  Result Value Ref Range Status   MRSA by PCR Next Gen NOT DETECTED NOT DETECTED Final    Comment: (NOTE) The GeneXpert MRSA Assay (FDA approved for NASAL specimens only), is one component of a comprehensive MRSA colonization surveillance program. It is not intended to diagnose MRSA infection nor to guide or  monitor treatment for MRSA infections. Test performance is not FDA approved in patients less than 58 years old. Performed at [facility], [address] [place], [place] [address]     Labs: CBC: No results for input(s): "WBC", "NEUTROABS", "HGB", "HCT", "MCV", "PLT" in the last 168 hours.  Basic Metabolic Panel: No results for input(s): "NA", "K", "CL", "CO2", "GLUCOSE", "BUN", "CREATININE", "CALCIUM", "MG", "PHOS" in the last 168 hours.  Liver Function Tests: No results for input(s): "AST", "ALT", "ALKPHOS", "BILITOT", "PROT", "ALBUMIN" in the last 168 hours. CBG: No results for input(s): "GLUCAP" in the last 168 hours.   Discharge time spent: less than 30 minutes.  Signed: Hollice Espy, MD [facility] 08/22/2022

## 2022-08-12 NOTE — Telephone Encounter (Signed)
-----   Message from Yvonne Kendall, MD sent at 08/12/2022  9:59 AM EDT ----- Regarding: TOC/hospital f/u Good morning,  Mr. Indelicato was admitted with STEMI on Sunday and will be d/c'ed home today.  Can you arrange for him to have a TOC f/u with Dr. Kirke Corin or APP in ~2 weeks.  He will need a BMP at that time as well.  Thanks.  Thayer Ohm

## 2022-08-12 NOTE — Progress Notes (Signed)
Rounding Note    Patient Name: Steven Kirk Date of Encounter: 08/12/2022  [facility] Cardiologist: Kirke Corin  Subjective   Feeling well without chest pain, shortness of breath, palpitations, or lightheadedness.  He ambulated around the ICU today without any symptoms/limitations.  Inpatient Medications    Scheduled Meds:  aspirin  81 mg Oral Daily   atorvastatin  80 mg Oral Daily   carvedilol  3.125 mg Oral BID WC   Chlorhexidine Gluconate Cloth  6 each Topical Q0600   clopidogrel  75 mg Oral Q breakfast   enoxaparin (LOVENOX) injection  40 mg Subcutaneous Q24H   losartan  50 mg Oral Daily   sodium chloride flush  3 mL Intravenous Q12H   Continuous Infusions:  sodium chloride     PRN Meds: sodium chloride, acetaminophen, ondansetron (ZOFRAN) IV, sodium chloride flush   Vital Signs    Vitals:   08/12/22 0500 08/12/22 0600 08/12/22 0700 08/12/22 0800  BP: 118/85 112/77 112/83 125/85  Pulse: (!) 57 (!) 57 61 62  Resp: (!) 21 16 17 12   Temp:      TempSrc:      SpO2: 98% 99% 96% 99%  Weight:      Height:        Intake/Output Summary (Last 24 hours) at 08/12/2022 0950 Last data filed at 08/12/2022 0827 Gross per 24 hour  Intake 600 ml  Output 1800 ml  Net -1200 ml      08/10/2022    5:21 PM 08/06/2022   10:56 PM 10/05/2021    7:14 AM  Last 3 Weights  Weight (lbs) 170 lb 200 lb 9.9 oz 190 lb  Weight (kg) 77.111 kg 91 kg 86.183 kg      Telemetry    Normal sinus rhythm and sinus bradycardia (HR 55/75 bpm) - Personally Reviewed  ECG    No new tracing  Physical Exam   GEN: No acute distress.   Neck: No JVD Cardiac: RRR, no murmurs, rubs, or gallops.  Respiratory: Clear to auscultation bilaterally. GI: Soft, nontender, non-distended  MS: No edema; No deformity.  Right radial site covered with clean dressing; no hematoma.  2+ right radial pulse. Neuro:  Nonfocal  Psych: Normal affect   Labs    High Sensitivity Troponin:   Recent Labs   Lab 08/10/22 1728 08/10/22 1932 08/11/22 0550  TROPONINIHS 30* 538* 21,920*     Chemistry Recent Labs  Lab 08/10/22 1728 08/11/22 0327 08/12/22 0341  NA 138 138 139  K 3.7 3.9 4.1  CL 102 106 109  CO2 24 25 25   GLUCOSE 154* 114* 106*  BUN 13 12 11   CREATININE 1.29* 1.07 1.11  CALCIUM 9.5 8.6* 9.1  GFRNONAA >60 >60 >60  ANIONGAP 12 7 5     Lipids No results for input(s): "CHOL", "TRIG", "HDL", "LABVLDL", "LDLCALC", "CHOLHDL" in the last 168 hours.  Hematology Recent Labs  Lab 08/10/22 1728 08/11/22 0327 08/12/22 0341  WBC 9.7 11.0* 8.9  RBC 4.90 4.59 4.70  HGB 14.8 14.0 14.4  HCT 43.9 41.1 42.6  MCV 89.6 89.5 90.6  MCH 30.2 30.5 30.6  MCHC 33.7 34.1 33.8  RDW 11.9 11.9 11.8  PLT 211 201 209   Thyroid No results for input(s): "TSH", "FREET4" in the last 168 hours.  BNPNo results for input(s): "BNP", "PROBNP" in the last 168 hours.  DDimer No results for input(s): "DDIMER" in the last 168 hours.   Radiology    ECHOCARDIOGRAM COMPLETE  Result Date:  08/11/2022    ECHOCARDIOGRAM REPORT   Patient Name:   Steven Kirk Date of Exam: 08/11/2022 Medical Rec #:  [id number]          Height:       70.0 in Accession #:    [id number]         Weight:       170.0 lb Date of Birth:  June 22, 1976          BSA:          1.948 m Patient Age:    46 years           BP:           155/78 mmHg Patient Gender: M                  HR:           50 bpm. Exam Location:  [facility] Procedure: 2D Echo, Cardiac Doppler and Color Doppler Indications:     I21.9 Acute myocardial infarction  History:         Patient has no prior history of Echocardiogram examinations.                  Angioplasty; Risk Factors:Current Smoker.  Sonographer:     Ceasar Mons Referring Phys:  [id number] A ARIDA Diagnosing Phys: Lorine Bears MD  Sonographer Comments: Image acquisition challenging due to respiratory motion. IMPRESSIONS  1. Left ventricular ejection fraction, by estimation, is 55 to 60%. The left ventricle has  normal function. The left ventricle has no regional wall motion abnormalities. There is mild left ventricular hypertrophy. Left ventricular diastolic parameters were normal.  2. Right ventricular systolic function is normal. The right ventricular size is normal. Tricuspid regurgitation signal is inadequate for assessing PA pressure.  3. The mitral valve is normal in structure. No evidence of mitral valve regurgitation. No evidence of mitral stenosis.  4. The aortic valve is normal in structure. Aortic valve regurgitation is not visualized. No aortic stenosis is present.  5. The inferior vena cava is normal in size with greater than 50% respiratory variability, suggesting right atrial pressure of 3 mmHg. FINDINGS  Left Ventricle: Left ventricular ejection fraction, by estimation, is 55 to 60%. The left ventricle has normal function. The left ventricle has no regional wall motion abnormalities. The left ventricular internal cavity size was normal in size. There is  mild left ventricular hypertrophy. Left ventricular diastolic parameters were normal. Right Ventricle: The right ventricular size is normal. No increase in right ventricular wall thickness. Right ventricular systolic function is normal. Tricuspid regurgitation signal is inadequate for assessing PA pressure. Left Atrium: Left atrial size was normal in size. Right Atrium: Right atrial size was normal in size. Pericardium: There is no evidence of pericardial effusion. Mitral Valve: The mitral valve is normal in structure. No evidence of mitral valve regurgitation. No evidence of mitral valve stenosis. Tricuspid Valve: The tricuspid valve is normal in structure. Tricuspid valve regurgitation is trivial. No evidence of tricuspid stenosis. Aortic Valve: The aortic valve is normal in structure. Aortic valve regurgitation is not visualized. No aortic stenosis is present. Aortic valve mean gradient measures 4.0 mmHg. Aortic valve peak gradient measures 7.0 mmHg.  Aortic valve area, by VTI measures 2.69 cm. Pulmonic Valve: The pulmonic valve was normal in structure. Pulmonic valve regurgitation is not visualized. No evidence of pulmonic stenosis. Aorta: The aortic root is normal in size and structure. Venous: The inferior vena cava is normal in  size with greater than 50% respiratory variability, suggesting right atrial pressure of 3 mmHg. IAS/Shunts: No atrial level shunt detected by color flow Doppler.  LEFT VENTRICLE PLAX 2D LVIDd:         4.90 cm   Diastology LVIDs:         3.64 cm   LV e' medial:    8.70 cm/s LV PW:         1.23 cm   LV E/e' medial:  10.4 LV IVS:        1.13 cm   LV e' lateral:   11.90 cm/s LVOT diam:     2.10 cm   LV E/e' lateral: 7.6 LV SV:         78 LV SV Index:   40 LVOT Area:     3.46 cm  RIGHT VENTRICLE RV Basal diam:  3.41 cm RV S prime:     14.80 cm/s TAPSE (M-mode): 3.5 cm LEFT ATRIUM             Index        RIGHT ATRIUM           Index LA diam:        3.40 cm 1.75 cm/m   RA Area:     18.80 cm LA Vol (A2C):   67.4 ml 34.60 ml/m  RA Volume:   53.60 ml  27.52 ml/m LA Vol (A4C):   38.5 ml 19.76 ml/m LA Biplane Vol: 53.3 ml 27.36 ml/m  AORTIC VALVE AV Area (Vmax):    2.99 cm AV Area (Vmean):   2.67 cm AV Area (VTI):     2.69 cm AV Vmax:           132.00 cm/s AV Vmean:          96.300 cm/s AV VTI:            0.290 m AV Peak Grad:      7.0 mmHg AV Mean Grad:      4.0 mmHg LVOT Vmax:         114.00 cm/s LVOT Vmean:        74.100 cm/s LVOT VTI:          0.225 m LVOT/AV VTI ratio: 0.78  AORTA Ao Root diam: 3.60 cm MITRAL VALVE MV Area (PHT): 2.85 cm    SHUNTS MV Decel Time: 266 msec    Systemic VTI:  0.22 m MV E velocity: 90.40 cm/s  Systemic Diam: 2.10 cm MV A velocity: 67.70 cm/s MV E/A ratio:  1.34 Lorine Bears MD Electronically signed by Lorine Bears MD Signature Date/Time: 08/11/2022/4:51:52 PM    Final    CARDIAC CATHETERIZATION  Result Date: 08/10/2022   Ramus lesion is 30% stenosed.   Lat 1st Mrg lesion is 100% stenosed.    Balloon angioplasty was performed using a BALLN EUPHORA RX 2.0X12.   Post intervention, there is a 100% residual stenosis.   The left ventricular systolic function is normal.   LV Mckinnon Glick diastolic pressure is mildly elevated.   The left ventricular ejection fraction is 50-55% by visual estimate. 1.  Significant one-vessel coronary artery disease with occluded inferior branch of OM1 which is a likely culprit for myocardial infarction.  No other obstructive disease. 2.  Low normal LV systolic function and mildly elevated left ventricular Khadim Lundberg-diastolic pressure. 3.  Successful attempted angioplasty of the inferior branch of OM 1.  It was clear after establishing flow that the branch was too small and the location  was too distal to do any further ballooning or stenting.  There was still TIMI 0 flow in that location at the Darcy Cordner of procedure. Recommendations: The antiplatelet therapy for at least 1 year. Aggressive treatment of risk factors and smoking cessation.   DG Chest Port 1 View  Result Date: 08/10/2022 CLINICAL DATA:  Chest pain EXAM: PORTABLE CHEST 1 VIEW COMPARISON:  11/21/2018 FINDINGS: The heart size and mediastinal contours are within normal limits. Both lungs are clear. The visualized skeletal structures are unremarkable. IMPRESSION: No active disease. Electronically Signed   By: Duanne Guess D.O.   On: 08/10/2022 17:47    Cardiac Studies   See cath/echo above.  Patient Profile     46 y.o. male with h/o tobacco use, admitted with inferior STEMI due to occluded branch of OM1.  Assessment & Plan    Inferior STEMI: Culprit lesion was occluded branch of OM1.  PTCA attempted but unable to reestablish flow with small vessel size and distal location.  Patient currently asymptomatic.  LVEF normal without wall motion abnormality on yesterday's echo. -Contine DAPT with ASA and clopidogrel for 12 months. -Continue atorvastatin 80 mg daily. -Smoking cessation encouraged. -Outpatient cardiac rehab  referral ordered.  Hypertension: BP well-controlled this morning. -Continue carvedilol 3.125 mg BID and losartan 50 mg daily. -Will need BMP in ~2 weeks with addition of losartan.  [facility] will sign off.   Medication Recommendations:  Continue current doses of aspirin, clopidogrel, atorvastatin, carvedilol, and losartan. Other recommendations (labs, testing, etc):  BMP in ~2 weeks. Follow up as an outpatient:  F/u with Dr. Kirke Corin or APP in ~2 weeks.  Patient should remain out of work the remainder of this week.  For questions or updates, please contact [facility] Please consult [email or web address] for contact info under [facility].     Signed, Yvonne Kendall, MD  08/12/2022, 9:50 AM

## 2022-08-13 ENCOUNTER — Telehealth: Payer: Self-pay | Admitting: *Deleted

## 2022-08-13 NOTE — Telephone Encounter (Signed)
Transition Care Management Follow-up Telephone Call Date of discharge and from where: Tuesday 08/12/22 How have you been since you were released from the hospital? No complaints Any questions or concerns? No  Items Reviewed: Did the pt receive and understand the discharge instructions provided? Yes  Medications obtained and verified? Yes  Other? No  Any new allergies since your discharge? No  Dietary orders reviewed? No Do you have support at home? Yes   Home Care and Equipment/Supplies: Were home health services ordered? no If so, what is the name of the agency? N/A  Has the agency set up a time to come to the patient's home? not applicable Were any new equipment or medical supplies ordered?  No What is the name of the medical supply agency? N/A Were you able to get the supplies/equipment? not applicable Do you have any questions related to the use of the equipment or supplies? No  Functional Questionnaire: (I = Independent and D = Dependent) ADLs: I  Bathing/Dressing- I  Meal Prep- I  Eating- I  Maintaining continence- I  Transferring/Ambulation- I  Managing Meds- I  Follow up appointments reviewed:  PCP Hospital f/u appt confirmed?  The patient currently has no PCP.   Specialist Hospital f/u appt confirmed? Yes  Scheduled to see Charlsie Quest, NP on Thursday 08/21/22 @ 2:15 pm. Are transportation arrangements needed? No  If their condition worsens, is the pt aware to call PCP or go to the Emergency Dept.? Yes Was the patient provided with contact information for the PCP's office or ED? No Was to pt encouraged to call back with questions or concerns? Yes

## 2022-08-13 NOTE — Telephone Encounter (Signed)
-----   Message from Dalia Heading sent at 08/13/2022  8:51 AM EDT ----- Regarding: FW: TOC/hospital f/u 9/7 with Christian Mate ----- Message ----- From: Yvonne Kendall, MD Sent: 08/12/2022  10:00 AM EDT To: [facility] Triage; [facility] Scheduling Subject: TOC/hospital f/u                               Good morning,  Mr. Sloane was admitted with STEMI on Sunday and will be d/c'ed home today.  Can you arrange for him to have a TOC f/u with Dr. Kirke Corin or APP in ~2 weeks.  He will need a BMP at that time as well.  Thanks.  Thayer Ohm

## 2022-08-14 NOTE — Telephone Encounter (Signed)
See note 08/13/22.

## 2022-08-15 LAB — POCT ACTIVATED CLOTTING TIME: Activated Clotting Time: 317 seconds

## 2022-08-21 ENCOUNTER — Ambulatory Visit: Payer: BLUE CROSS/BLUE SHIELD | Attending: Cardiology | Admitting: Cardiology

## 2022-08-21 ENCOUNTER — Encounter: Payer: Self-pay | Admitting: Cardiology

## 2022-08-21 VITALS — BP 116/78 | HR 61 | Ht 70.0 in | Wt 179.2 lb

## 2022-08-21 DIAGNOSIS — I2510 Atherosclerotic heart disease of native coronary artery without angina pectoris: Secondary | ICD-10-CM

## 2022-08-21 DIAGNOSIS — E785 Hyperlipidemia, unspecified: Secondary | ICD-10-CM

## 2022-08-21 DIAGNOSIS — I214 Non-ST elevation (NSTEMI) myocardial infarction: Secondary | ICD-10-CM | POA: Diagnosis not present

## 2022-08-21 DIAGNOSIS — I2129 ST elevation (STEMI) myocardial infarction involving other sites: Secondary | ICD-10-CM

## 2022-08-21 DIAGNOSIS — F1290 Cannabis use, unspecified, uncomplicated: Secondary | ICD-10-CM

## 2022-08-21 DIAGNOSIS — Z720 Tobacco use: Secondary | ICD-10-CM

## 2022-08-21 DIAGNOSIS — I10 Essential (primary) hypertension: Secondary | ICD-10-CM | POA: Diagnosis not present

## 2022-08-21 MED ORDER — NITROGLYCERIN 0.4 MG SL SUBL: 0.4000 mg | SUBLINGUAL_TABLET | SUBLINGUAL | 2 refills | Status: DC | PRN

## 2022-08-21 NOTE — Patient Instructions (Signed)
Medication Instructions:  Your physician has recommended you make the following change in your medication:   START AS NEEDED Nitroglycerin 0.4 mg take 1 tablet if having chest pain. If a single episode of chest pain is not relieved by one tablet, the patient will try another within 5 minutes; and if this doesn't relieve the pain, the patient is instructed to call 911 for transportation to an emergency department.   *If you need a refill on your cardiac medications before your next appointment, please call your pharmacy*   Lab Work: None  If you have labs (blood work) drawn today and your tests are completely normal, you will receive your results only by: MyChart Message (if you have MyChart) OR A paper copy in the mail If you have any lab test that is abnormal or we need to change your treatment, we will call you to review the results.   Testing/Procedures: None   Follow-Up: At [facility], you and your health needs are our priority.  As part of our continuing mission to provide you with exceptional heart care, we have created designated Provider Care Teams.  These Care Teams include your primary Cardiologist (physician) and Advanced Practice Providers (APPs -  Physician Assistants and Nurse Practitioners) who all work together to provide you with the care you need, when you need it.   Your next appointment:   4 week(s)  The format for your next appointment:   In Person  Provider:   Lorine Bears, MD or Charlsie Quest, NP    Other Instructions Cardiac Rehab will contact you to arrange appointment. Their number is [phone]  Important Information About Sugar

## 2022-08-21 NOTE — Progress Notes (Signed)
Cardiology Clinic Note   Patient Name: Steven Kirk Date of Encounter: 08/21/2022  Primary Care Provider:  Pcp, No Primary Cardiologist:  Lorine Bears, MD  Patient Profile    46 year old male patient with a past medical history of tobacco and marijuana use and new onset coronary artery disease with a recent inferior STEMI, who is here following up today on his coronary artery disease post hospitalization.  Past Medical History    History reviewed. No pertinent past medical history. Past Surgical History:  Procedure Laterality Date   CORONARY/GRAFT ACUTE MI REVASCULARIZATION N/A 08/10/2022   Procedure: Coronary/Graft Acute MI Revascularization;  Surgeon: Iran Ouch, MD;  Location: [facility] INVASIVE CV LAB;  Service: Cardiovascular;  Laterality: N/A;   KNEE SURGERY     LEFT HEART CATH AND CORONARY ANGIOGRAPHY N/A 08/10/2022   Procedure: LEFT HEART CATH AND CORONARY ANGIOGRAPHY;  Surgeon: Iran Ouch, MD;  Location: [facility] INVASIVE CV LAB;  Service: Cardiovascular;  Laterality: N/A;    Allergies  No Known Allergies  History of Present Illness    47 year old male with no prior cardiac history who had a known history of tobacco and marijuana use.  He was evaluated in the hospital for STEMI 08/10/22 and was subsequently discharged home on 08/12/2022.  He presented to the  [facility] emergency department on 8/27 evaluation of an inferior STEMI. He had started having chest pain during the afternoon described as a substernal tightness feeling with associated shortness of breath.  The pain subsided and resolved by the time he went to sleep the night before.  He woke up without symptoms but then went to work and started having similar chest pain the afternoon but it was more intense.  The pain continued and thus he continued to the emergency department for evaluation.  He had an EKG done that showed moderate inferior ST elevation.  Changes were new compared to his previous EKG from  2019 and a code STEMI was activated he was placed on a heparin drip and taken to the Cath Lab for emergent catheterization.  08/10/22 Left heart catheterization revealed a ramus lesion with 30% stenosis, lateral first marginal lesion is 100% stenosed, balloon angioplasty was performed.  Postintervention there was 100% residual stenosis, the left jugular systolic function was normal, LV end-diastolic pressure was mildly elevated, the left ventricular ejection fraction was 50 to 55% by visual estimate.  Side significant one-vessel coronary artery disease with an occluded inferior branch of the OM1 which is likely the culprit for his myocardial infarction.  No other obstructive disease was noted.  There was successful attempted angioplasty of the inferior branch of the OM1.  The branch was too small and the location was too distal to do further ballooning or stenting.  It was recommended that he continue on antiplatelet therapy for at least 1 year.  Aggressive treatment of risk factors and smoking cessation.  Echocardiogram was completed on 08/11/2022 which revealed LVEF 55-60%, no regional wall motion abnormalities, mild left ventricular hypertrophy, right ventricular systolic function was normal, and no valvular abnormalities were noted.  He returns to clinic today stating that he has been doing fairly well.  He has multiple questions about activity limitations, dietary restrictions, and sexual activity.  He denies any recurrent chest pain, shortness of breath, or any noted side effects from new medications.  He is also has returned to work, working on [employer] duty .  Denies any recurrent hospitalizations or emergency department visits.  Home Medications  Current Outpatient Medications  Medication Sig Dispense Refill   aspirin 81 MG chewable tablet Chew 1 tablet (81 mg total) by mouth daily. 30 tablet 11   atorvastatin (LIPITOR) 80 MG tablet Take 1 tablet (80 mg total) by mouth daily. 30 tablet 1    carvedilol (COREG) 3.125 MG tablet Take 1 tablet (3.125 mg total) by mouth 2 (two) times daily with a meal. 60 tablet 11   clopidogrel (PLAVIX) 75 MG tablet Take 1 tablet (75 mg total) by mouth daily with breakfast. 30 tablet 11   docusate sodium (COLACE) 100 MG capsule Take 1 tablet once or twice daily as needed for constipation while taking narcotic pain medicine 30 capsule 0   losartan (COZAAR) 50 MG tablet Take 1 tablet (50 mg total) by mouth daily. 30 tablet 11   nitroGLYCERIN (NITROSTAT) 0.4 MG SL tablet Place 1 tablet (0.4 mg total) under the tongue every 5 (five) minutes as needed for chest pain. 25 tablet 2   oxyCODONE-acetaminophen (PERCOCET) 5-325 MG tablet Take 2 tablets by mouth every 6 (six) hours as needed for severe pain. 16 tablet 0   No current facility-administered medications for this visit.     Family History    History reviewed. No pertinent family history. has no family status information on file.   Social History    Social History   Socioeconomic History   Marital status: Single    Spouse name: Not on file   Number of children: Not on file   Years of education: Not on file   Highest education level: Not on file  Occupational History   Not on file  Tobacco Use   Smoking status: Former    Types: Cigarettes    Quit date: 08/17/2022    Years since quitting: 0.0   Smokeless tobacco: Never  Substance and Sexual Activity   Alcohol use: Not Currently   Drug use: Yes    Types: Marijuana   Sexual activity: Yes    Partners: Female    Birth control/protection: Condom  Other Topics Concern   Not on file  Social History Narrative   Not on file   Social Determinants of Health   Financial Resource Strain: Not on file  Food Insecurity: Not on file  Transportation Needs: Not on file  Physical Activity: Not on file  Stress: Not on file  Social Connections: Not on file  Intimate Partner Violence: Not on file     Review of Systems    General:  No chills,  fever, night sweats or weight changes.  Cardiovascular:  No chest pain, dyspnea on exertion, edema, orthopnea, palpitations, paroxysmal nocturnal dyspnea. Dermatological: No rash, lesions/masses Respiratory: No cough, dyspnea Urologic: No hematuria, dysuria Abdominal:   No nausea, vomiting, diarrhea, bright red blood per rectum, melena, or hematemesis Neurologic:  No visual changes, wkns, changes in mental status. All other systems reviewed and are otherwise negative except as noted above.    Cardiac Rehabilitation Eligibility Assessment  The patient is ready to start cardiac rehabilitation from a cardiac standpoint.    Physical Exam    VS:  BP 116/78 (BP Location: Left Arm, Patient Position: Sitting, Cuff Size: Normal)   Pulse 61   Ht 5\' 10"  (1.778 m)   Wt 179 lb 4 oz (81.3 kg)   SpO2 98%   BMI 25.72 kg/m  , BMI Body mass index is 25.72 kg/m.     GEN: Well nourished, well developed, in no acute distress. HEENT: normal. Neck: Supple, no  JVD, carotid bruits, or masses. Cardiac: RRR, no murmurs, rubs, or gallops. No clubbing, cyanosis, edema.  Radials/DP/PT 2+ and equal bilaterally.  Respiratory:  Respirations regular and unlabored, clear to auscultation bilaterally. GI: Soft, nontender, nondistended, BS + x 4. MS: no deformity or atrophy. Skin: warm and dry, no rash. Neuro:  Strength and sensation are intact. Psych: Normal affect.  Accessory Clinical Findings    ECG personally reviewed by me today-sinus rhythm rate of 61 with ST depression T wave inversions in leads II, III, aVF,V5- V6- No acute changes  Lab Results  Component Value Date   WBC 8.9 08/12/2022   HGB 14.4 08/12/2022   HCT 42.6 08/12/2022   MCV 90.6 08/12/2022   PLT 209 08/12/2022   Lab Results  Component Value Date   CREATININE 1.11 08/12/2022   BUN 11 08/12/2022   NA 139 08/12/2022   K 4.1 08/12/2022   CL 109 08/12/2022   CO2 25 08/12/2022   No results found for: "ALT", "AST", "GGT", "ALKPHOS",  "BILITOT" No results found for: "CHOL", "HDL", "LDLCALC", "LDLDIRECT", "TRIG", "CHOLHDL"  Lab Results  Component Value Date   HGBA1C 5.6 08/12/2022    Assessment & Plan   1.  Coronary artery disease s/p inferior STEMI with culprit lesion found to be an occluded branch of OM1.  Unfortunately PTCA was attempted but unable to reestablish flow with small vessel size and distal location.  Patient currently remains chest pain-free.  He is continued on DAPT with aspirin and clopidogrel for minimum of 12 months, continued on atorvastatin 80 mg daily, has been congratulated on his smoking cessation as he has not smoked in over 4 days.  He is also being referred to cardiac rehab as he has several questions related to diet, activity, exercise, and sexual activity.  We have discussed all of these at length today as well. He was also given Nitrostat 0.4 mg to have with instructions and side effects explained to him during his appointment today.  He is also being sent for a BMP as he did have a slight bump in his creatinine noted while he was in the hospital with a serum creatinine of 1.29 to follow-up on his kidney function after starting medications  2.  Hypertension with blood pressure 116/78 which is now well controlled on his medications.  He will continue Coreg 3.125 mg twice daily and losartan 50 mg daily.  There were no changes made to his medication regimen today.  3.  Tobacco and marijuana use cessation recommended  4.  Disposition patient is return to clinic to see MD/APP in 4 weeks or sooner if needed  Clif Serio, NP 08/21/2022, 4:23 PM

## 2022-08-22 NOTE — Assessment & Plan Note (Signed)
Presented as outlined above and taken emergently to Cath Lab and underwent.  Balloon angioplasty to inferior branch of OM1 on 8/27.  Outpatient follow-up with cardiology in the next 2 weeks.  DAPT with aspirin and Plavix for 1 year.  Started on Coreg, Lipitor, losartan.  Echocardiogram done 8/28 unremarkable with preserved ejection fraction no evidence of diastolic dysfunction.

## 2022-08-22 NOTE — Assessment & Plan Note (Signed)
>>  ASSESSMENT AND PLAN FOR HISTORY OF ST ELEVATION MYOCARDIAL INFARCTION (STEMI) WRITTEN ON 08/22/2022 12:19 PM BY Hollice Espy, MD  Presented as outlined above and taken emergently to Cath Lab and underwent.  Balloon angioplasty to inferior branch of OM1 on 8/27.  Outpatient follow-up with cardiology in the next 2 weeks.  DAPT with aspirin and Plavix for 1 year.  Started on Coreg, Lipitor, losartan.  Echocardiogram done 8/28 unremarkable with preserved ejection fraction no evidence of diastolic dysfunction.

## 2022-09-10 ENCOUNTER — Encounter: Payer: BLUE CROSS/BLUE SHIELD | Attending: Cardiovascular Disease | Admitting: *Deleted

## 2022-09-10 ENCOUNTER — Encounter: Payer: Self-pay | Admitting: *Deleted

## 2022-09-10 DIAGNOSIS — Z955 Presence of coronary angioplasty implant and graft: Secondary | ICD-10-CM

## 2022-09-10 DIAGNOSIS — I213 ST elevation (STEMI) myocardial infarction of unspecified site: Secondary | ICD-10-CM

## 2022-09-10 NOTE — Progress Notes (Signed)
Virtual orientation call completed today. he has an appointment on Date: 09/24/2022  for EP eval and gym Orientation.  Documentation of diagnosis can be found in [facility] Date: 08/10/2022 .  Brandi is a current tobacco user. Intervention for tobacco cessation was provided at the initial medical review. He was asked about readiness to quit and reported he is working on weaning off his cigarette use. He is down to 5 cigarettes a day and is weaning down by one cigarette a week.  10/20/2022 is is QUIT date . Patient was advised and educated about tobacco cessation using combination therapy, tobacco cessation classes, quit line, and quit smoking apps. Patient demonstrated understanding of this material. Staff will continue to provide encouragement and follow up with the patient throughout the program.

## 2022-09-17 NOTE — Progress Notes (Deleted)
Cardiology Clinic Note   Patient Name: Steven Kirk Date of Encounter: 09/17/2022  Primary Care Provider:  Pcp, No Primary Cardiologist:  Kathlyn Sacramento, MD  Patient Profile    46 year old male with past medical history of tobacco and marijuana use and coronary artery disease with recent inferior STEMI, who is here today following up on his coronary artery disease.  Past Medical History    No past medical history on file. Past Surgical History:  Procedure Laterality Date   CORONARY/GRAFT ACUTE MI REVASCULARIZATION N/A 08/10/2022   Procedure: Coronary/Graft Acute MI Revascularization;  Surgeon: Wellington Hampshire, MD;  Location: [place] [facility];  Service: Cardiovascular;  Laterality: N/A;   KNEE SURGERY     LEFT HEART CATH AND CORONARY ANGIOGRAPHY N/A 08/10/2022   Procedure: LEFT HEART CATH AND CORONARY ANGIOGRAPHY;  Surgeon: Wellington Hampshire, MD;  Location: [place] [facility];  Service: Cardiovascular;  Laterality: N/A;    Allergies  No Known Allergies  History of Present Illness    45 year old male with a known history of tobacco and marijuana use, and coronary artery disease with recent STEMI 08/10/2022.  He presented to the [facility] emergency department on 08/10/2022 for evaluation of inferior STEMI.  He had started with complaints of chest pain during the afternoon described as substernal and tightness with associated shortness of breath.  The pain subsided and resolved by the time he had went to sleep the night before.  He woke up without symptoms and went on to work the following morning and started having similar chest pain that afternoon that was much more intense.  The pain continued and thus he was brought to the emergency department for further evaluation.  Upon arrival he had an EKG that showed moderate inferior ST elevation.  Changes were new compared to his previous EKG from 2019 and a code STEMI was activated.  He was placed on a heparin drip was taken to the  Cath Lab for emergent catheterization.  Left heart catheterization revealed a ramus lesion with 30% stenosis, lateral first marginal lesion 100% stenosed, balloon angioplasty was performed.  Left ventricular systolic function was normal, LVEDP was mildly elevated, left ventricular ejection fraction was 50-55% by visual estimate.  Successful attempted angioplasty of the inferior branch of OM1.  It was clear after establishing flow that the brain all and the location was too distal to do any further ballooning or stenting.  Echocardiogram revealed LVEF 55-60%, no regional wall motion abnormalities, and no valvular abnormalities were noted.  He was last evaluated in clinic 08/21/2022 and was doing fairly well from a cardiac standpoint.  He had several questions about returning to normal activity, was advised of medication requirements, and the importance of cardiac rehab.  He returns to clinic today  Home Medications    Current Outpatient Medications  Medication Sig Dispense Refill   aspirin 81 MG chewable tablet Chew 1 tablet (81 mg total) by mouth daily. 30 tablet 11   atorvastatin (LIPITOR) 80 MG tablet Take 1 tablet (80 mg total) by mouth daily. 30 tablet 1   carvedilol (COREG) 3.125 MG tablet Take 1 tablet (3.125 mg total) by mouth 2 (two) times daily with a meal. 60 tablet 11   clopidogrel (PLAVIX) 75 MG tablet Take 1 tablet (75 mg total) by mouth daily with breakfast. 30 tablet 11   docusate sodium (COLACE) 100 MG capsule Take 1 tablet once or twice daily as needed for constipation while taking narcotic pain medicine (Patient  not taking: Reported on 09/10/2022) 30 capsule 0   losartan (COZAAR) 50 MG tablet Take 1 tablet (50 mg total) by mouth daily. 30 tablet 11   nitroGLYCERIN (NITROSTAT) 0.4 MG SL tablet Place 1 tablet (0.4 mg total) under the tongue every 5 (five) minutes as needed for chest pain. 25 tablet 2   oxyCODONE-acetaminophen (PERCOCET) 5-325 MG tablet Take 2 tablets by mouth every  6 (six) hours as needed for severe pain. (Patient not taking: Reported on 09/10/2022) 16 tablet 0   No current facility-administered medications for this visit.     Family History    No family history on file. has no family status information on file.   Social History    Social History   Socioeconomic History   Marital status: Single    Spouse name: Not on file   Number of children: Not on file   Years of education: Not on file   Highest education level: Not on file  Occupational History   Not on file  Tobacco Use   Smoking status: Every Day    Packs/day: 1.00    Years: 20.00    Total pack years: 20.00    Types: Cigarettes   Smokeless tobacco: Never  Vaping Use   Vaping Use: Never used  Substance and Sexual Activity   Alcohol use: Not Currently   Drug use: Yes    Types: Marijuana   Sexual activity: Yes    Partners: Female    Birth control/protection: Condom  Other Topics Concern   Not on file  Social History Narrative   Not on file   Social Determinants of Health   Financial Resource Strain: Not on file  Food Insecurity: Not on file  Transportation Needs: Not on file  Physical Activity: Not on file  Stress: Not on file  Social Connections: Not on file  Intimate Partner Violence: Not on file     Review of Systems    General:  No chills, fever, night sweats or weight changes.  Cardiovascular:  No chest pain, dyspnea on exertion, edema, orthopnea, palpitations, paroxysmal nocturnal dyspnea. Dermatological: No rash, lesions/masses Respiratory: No cough, dyspnea Urologic: No hematuria, dysuria Abdominal:   No nausea, vomiting, diarrhea, bright red blood per rectum, melena, or hematemesis Neurologic:  No visual changes, wkns, changes in mental status. All other systems reviewed and are otherwise negative except as noted above.     Physical Exam    VS:  There were no vitals taken for this visit. , BMI There is no height or weight on file to calculate  BMI.     GEN: Well nourished, well developed, in no acute distress. HEENT: normal. Neck: Supple, no JVD, carotid bruits, or masses. Cardiac: RRR, no murmurs, rubs, or gallops. No clubbing, cyanosis, edema.  Radials/DP/PT 2+ and equal bilaterally.  Respiratory:  Respirations regular and unlabored, clear to auscultation bilaterally. GI: Soft, nontender, nondistended, BS + x 4. MS: no deformity or atrophy. Skin: warm and dry, no rash. Neuro:  Strength and sensation are intact. Psych: Normal affect.  Accessory Clinical Findings    ECG personally reviewed by me today- *** - No acute changes  Lab Results  Component Value Date   WBC 8.9 08/12/2022   HGB 14.4 08/12/2022   HCT 42.6 08/12/2022   MCV 90.6 08/12/2022   PLT 209 08/12/2022   Lab Results  Component Value Date   CREATININE 1.11 08/12/2022   BUN 11 08/12/2022   NA 139 08/12/2022   K 4.1 08/12/2022  CL 109 08/12/2022   CO2 25 08/12/2022   No results found for: "ALT", "AST", "GGT", "ALKPHOS", "BILITOT" No results found for: "CHOL", "HDL", "LDLCALC", "LDLDIRECT", "TRIG", "CHOLHDL"  Lab Results  Component Value Date   HGBA1C 5.6 08/12/2022    Assessment & Plan   1.  ***  Yuki Purves, NP 09/17/2022, 4:06 PM

## 2022-09-18 ENCOUNTER — Encounter: Payer: Self-pay | Admitting: Cardiology

## 2022-09-18 ENCOUNTER — Ambulatory Visit: Payer: BLUE CROSS/BLUE SHIELD | Attending: Cardiology | Admitting: Cardiology

## 2022-09-24 ENCOUNTER — Ambulatory Visit: Payer: BLUE CROSS/BLUE SHIELD

## 2022-09-24 ENCOUNTER — Telehealth: Payer: Self-pay

## 2022-09-24 NOTE — Telephone Encounter (Signed)
Left message for patient regarding cardiac rehab. Patient no call/no showed orientation appt today, 10/11 @ 2:30 PM.

## 2022-09-28 ENCOUNTER — Emergency Department
Admission: EM | Admit: 2022-09-28 | Discharge: 2022-09-28 | Disposition: A | Discharge status: Home / Self Care | Payer: BLUE CROSS/BLUE SHIELD | Attending: Student in an Organized Health Care Education/Training Program | Admitting: Student in an Organized Health Care Education/Training Program

## 2022-09-28 ENCOUNTER — Other Ambulatory Visit: Payer: Self-pay

## 2022-09-28 ENCOUNTER — Emergency Department: Payer: BLUE CROSS/BLUE SHIELD

## 2022-09-28 DIAGNOSIS — K6289 Other specified diseases of anus and rectum: Secondary | ICD-10-CM | POA: Diagnosis present

## 2022-09-28 LAB — CBC WITH DIFFERENTIAL/PLATELET
Abs Immature Granulocytes: 0.01 10*3/uL (ref 0.00–0.07)
Basophils Absolute: 0 10*3/uL (ref 0.0–0.1)
Basophils Relative: 0 %
Eosinophils Absolute: 0.1 10*3/uL (ref 0.0–0.5)
Eosinophils Relative: 1 %
HCT: 39.1 % (ref 39.0–52.0)
Hemoglobin: 13 g/dL (ref 13.0–17.0)
Immature Granulocytes: 0 %
Lymphocytes Relative: 30 %
Lymphs Abs: 2.4 10*3/uL (ref 0.7–4.0)
MCH: 30.7 pg (ref 26.0–34.0)
MCHC: 33.2 g/dL (ref 30.0–36.0)
MCV: 92.4 fL (ref 80.0–100.0)
Monocytes Absolute: 0.9 10*3/uL (ref 0.1–1.0)
Monocytes Relative: 11 %
Neutro Abs: 4.5 10*3/uL (ref 1.7–7.7)
Neutrophils Relative %: 58 %
Platelets: 180 10*3/uL (ref 150–400)
RBC: 4.23 MIL/uL (ref 4.22–5.81)
RDW: 12.6 % (ref 11.5–15.5)
WBC: 7.9 10*3/uL (ref 4.0–10.5)
nRBC: 0 % (ref 0.0–0.2)

## 2022-09-28 LAB — BASIC METABOLIC PANEL
Anion gap: 5 (ref 5–15)
BUN: 9 mg/dL (ref 6–20)
CO2: 27 mmol/L (ref 22–32)
Calcium: 9.1 mg/dL (ref 8.9–10.3)
Chloride: 105 mmol/L (ref 98–111)
Creatinine, Ser: 1.07 mg/dL (ref 0.61–1.24)
GFR, Estimated: >60 mL/min (ref >60)
Glucose, Bld: 87 mg/dL (ref 70–99)
Potassium: 3.9 mmol/L (ref 3.5–5.1)
Sodium: 137 mmol/L (ref 135–145)

## 2022-09-28 MED ORDER — IOHEXOL 300 MG/ML  SOLN
100.0000 mL | Freq: Once | INTRAMUSCULAR | Status: AC | PRN
  Administered 2022-09-28: 100 mL via INTRAVENOUS

## 2022-09-28 MED ORDER — DIBUCAINE (PERIANAL) 1 % EX OINT: 1.0000 | TOPICAL_OINTMENT | CUTANEOUS | 0 refills | Status: DC | PRN

## 2022-09-28 NOTE — ED Triage Notes (Signed)
Pt reports rectal pain x1 week with after straining to have a bowel movement. Denies previous dx of hemorrhoids but reports previous hx.

## 2022-09-28 NOTE — ED Provider Notes (Signed)
[facility] Provider Note    Event Date/Time   First MD Initiated Contact with Patient 09/28/22 2109     (approximate)   History   Hemorrhoids   HPI  Steven Kirk is a 46 y.o. male presents to the ER for evaluation of rectal pain.  States he was straining felt severe pain in his rectum did have some bleeding.  Since then his felt like he had some fullness in his rectum having moderate to severe pain no history of hemorrhoids.  Denies any trauma.      Physical Exam   Triage Vital Signs: ED Triage Vitals [09/28/22 2005]  Enc Vitals Group     BP (!) 148/89     Pulse Rate 80     Resp 18     Temp 98.4 F (36.9 C)     Temp Source Oral     SpO2 100 %     Weight 180 lb (81.6 kg)     Height 5\' 10"  (1.778 m)     Head Circumference      Peak Flow      Pain Score 10     Pain Loc      Pain Edu?      Excl. in GC?     Most recent vital signs: Vitals:   09/28/22 2005  BP: (!) 148/89  Pulse: 80  Resp: 18  Temp: 98.4 F (36.9 C)  SpO2: 100%     Constitutional: Alert  Eyes: Conjunctivae are normal.  Head: Atraumatic. Nose: No congestion/rhinnorhea. Mouth/Throat: Mucous membranes are moist.   Neck: Painless ROM.  Cardiovascular:   Good peripheral circulation. Respiratory: Normal respiratory effort.  No retractions.  Gastrointestinal: Soft and nontender.  Rectal exam with no hemorrhoid.  Does have some significant pain in the right perirectal area with no fluctuance no vesicles warts or evidence of bleeding. Musculoskeletal:  no deformity Neurologic:  MAE spontaneously. No gross focal neurologic deficits are appreciated.  Skin:  Skin is warm, dry and intact. No rash noted. Psychiatric: Mood and affect are normal. Speech and behavior are normal.    ED Results / Procedures / Treatments   Labs (all labs ordered are listed, but only abnormal results are displayed) Labs Reviewed  CBC WITH DIFFERENTIAL/PLATELET  BASIC METABOLIC PANEL      EKG     RADIOLOGY Please see ED Course for my review and interpretation.  I personally reviewed all radiographic images ordered to evaluate for the above acute complaints and reviewed radiology reports and findings.  These findings were personally discussed with the patient.  Please see medical record for radiology report.    PROCEDURES:  Critical Care performed: No  Procedures   MEDICATIONS ORDERED IN ED: Medications  iohexol (OMNIPAQUE) 300 MG/ML solution 100 mL (100 mLs Intravenous Contrast Given 09/28/22 2228)     IMPRESSION / MDM / ASSESSMENT AND PLAN / ED COURSE  I reviewed the triage vital signs and the nursing notes.                              Differential diagnosis includes, but is not limited to, hemorrhoid, fissure, proctitis, mass, abscess, fistula  Presented to the ER for evaluation of symptoms as described above.  No identifiable hemorrhoid no bleeding.  CT imaging will be ordered due to severity of his pain on exam concern for possible perirectal abscess.   Clinical Course as of 09/28/22 2305  Sun Sep 28, 2022  2245 CT imaging on my review and interpretation does not show any evidence of large mass or abscess. [PR]  2302 Patient reassessed remains well-appearing.  His exam at this point given his work-up is likely most consistent with anal fissure causing rectal spasm.  These findings were discussed with patient I discussed conservative management including sitz bath's.  Patient stable appropriate for outpatient follow-up. [PR]    Clinical Course User Index [PR] Merlyn Lot, MD     FINAL CLINICAL IMPRESSION(S) / ED DIAGNOSES   Final diagnoses:  Rectal pain     Rx / DC Orders   ED Discharge Orders          Ordered    dibucaine (NUPERCAINAL) 1 % OINT  As needed        09/28/22 2258             Note:  This document was prepared using Dragon voice recognition software and may include unintentional dictation errors.     Merlyn Lot, MD 09/28/22 [phone]

## 2023-07-02 ENCOUNTER — Encounter: Payer: Self-pay | Admitting: Urology

## 2023-07-02 ENCOUNTER — Ambulatory Visit: Payer: BLUE CROSS/BLUE SHIELD | Admitting: Urology

## 2023-07-02 VITALS — BP 127/68 | HR 76 | Ht 70.0 in | Wt 173.5 lb

## 2023-07-02 DIAGNOSIS — K6289 Other specified diseases of anus and rectum: Secondary | ICD-10-CM | POA: Diagnosis not present

## 2023-07-02 DIAGNOSIS — Z125 Encounter for screening for malignant neoplasm of prostate: Secondary | ICD-10-CM | POA: Diagnosis not present

## 2023-07-02 NOTE — Progress Notes (Signed)
   07/02/23 1:47 PM   Kelvis Dewayne Hansman January 13, 1976 [id number]  CC: Rectal pain, prostate questions  HPI: 47 year old male who made an appointment today for rectal pain.  He reports at least a year of intermittent burning at the rectum and pain with sitting.  This can come and go every few weeks or months.  He has no problems with bowel movements or with urination.  Denies any gross hematuria.  He was seen in the ER in October 2023 and symptoms were felt to be related to anal fissure/rectal spasm.  A CT pelvis was also performed at that time which was benign.  He is unsure if he has any family history of prostate or breast cancer.  He also is wondering if he should be screened for prostate cancer.   Surgical History: Past Surgical History:  Procedure Laterality Date   CORONARY/GRAFT ACUTE MI REVASCULARIZATION N/A 08/10/2022   Procedure: Coronary/Graft Acute MI Revascularization;  Surgeon: Iran Ouch, MD;  Location: [facility];  Service: Cardiovascular;  Laterality: N/A;   KNEE SURGERY     LEFT HEART CATH AND CORONARY ANGIOGRAPHY N/A 08/10/2022   Procedure: LEFT HEART CATH AND CORONARY ANGIOGRAPHY;  Surgeon: Iran Ouch, MD;  Location: [facility] INVASIVE [facility];  Service: Cardiovascular;  Laterality: N/A;   Social History:  reports that he has been smoking cigarettes. He has a 20 pack-year smoking history. He has never used smokeless tobacco. He reports that he does not currently use alcohol. He reports current drug use. Drug: Marijuana.  Physical Exam: BP 127/68 (BP Location: Right Arm, Patient Position: Sitting, Cuff Size: Normal)   Pulse 76   Ht 5\' 10"  (1.778 m)   Wt 173 lb 8 oz (78.7 kg)   BMI 24.89 kg/m    Constitutional:  Alert and oriented, No acute distress. Cardiovascular: No clubbing, cyanosis, or edema. Respiratory: Normal respiratory effort, no increased work of breathing. GI: Abdomen is soft, nontender, nondistended, no abdominal masses DRE: No external  anal lesions, prostate 30 g, smooth, no nodules or masses  Pertinent Imaging: I have personally viewed and interpreted the CT pelvis from October 2023 showing no abnormalities.  Assessment & Plan:   48 year old male who made an appointment today for intermittent rectal burning of unclear etiology that has been intermittent over the last year.  His external anal exam is benign, DRE is benign, CT pelvis from October 2023 was benign.  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  We also reviewed the AUA guidelines regarding the risks and benefits of PSA screening, with his African-American heritage I do think it is reasonable to check a PSA today.  Will call with results.  Referral placed to GI for rectal pain/burning Call with PSA results  Legrand Rams, MD 07/02/2023  [facility] [address], [address] [place], [place] [address] [phone]

## 2023-07-02 NOTE — Patient Instructions (Signed)

## 2023-07-03 LAB — PSA TOTAL (REFLEX TO FREE): Prostate Specific Ag, Serum: 0.4 ng/mL (ref 0.0–4.0)

## 2023-07-11 ENCOUNTER — Encounter: Payer: Self-pay | Admitting: Emergency Medicine

## 2023-07-11 ENCOUNTER — Emergency Department
Admission: EM | Admit: 2023-07-11 | Discharge: 2023-07-11 | Disposition: A | Discharge status: Home / Self Care | Payer: BLUE CROSS/BLUE SHIELD | Attending: Emergency Medicine | Admitting: Emergency Medicine

## 2023-07-11 ENCOUNTER — Emergency Department: Payer: BLUE CROSS/BLUE SHIELD

## 2023-07-11 ENCOUNTER — Other Ambulatory Visit: Payer: Self-pay

## 2023-07-11 DIAGNOSIS — I10 Essential (primary) hypertension: Secondary | ICD-10-CM | POA: Diagnosis not present

## 2023-07-11 DIAGNOSIS — I2510 Atherosclerotic heart disease of native coronary artery without angina pectoris: Secondary | ICD-10-CM | POA: Diagnosis not present

## 2023-07-11 DIAGNOSIS — R319 Hematuria, unspecified: Secondary | ICD-10-CM | POA: Diagnosis present

## 2023-07-11 DIAGNOSIS — Z202 Contact with and (suspected) exposure to infections with a predominantly sexual mode of transmission: Secondary | ICD-10-CM | POA: Diagnosis not present

## 2023-07-11 DIAGNOSIS — N410 Acute prostatitis: Secondary | ICD-10-CM | POA: Insufficient documentation

## 2023-07-11 DIAGNOSIS — N419 Inflammatory disease of prostate, unspecified: Secondary | ICD-10-CM

## 2023-07-11 HISTORY — DX: Essential (primary) hypertension: I10

## 2023-07-11 HISTORY — DX: Atherosclerotic heart disease of native coronary artery without angina pectoris: I25.10

## 2023-07-11 LAB — CBC
HCT: 38.7 % — ABNORMAL LOW (ref 39.0–52.0)
Hemoglobin: 13 g/dL (ref 13.0–17.0)
MCH: 31.1 pg (ref 26.0–34.0)
MCHC: 33.6 g/dL (ref 30.0–36.0)
MCV: 92.6 fL (ref 80.0–100.0)
Platelets: 188 10*3/uL (ref 150–400)
RBC: 4.18 MIL/uL — ABNORMAL LOW (ref 4.22–5.81)
RDW: 12.2 % (ref 11.5–15.5)
WBC: 6.3 10*3/uL (ref 4.0–10.5)
nRBC: 0 % (ref 0.0–0.2)

## 2023-07-11 LAB — TSH: TSH: 0.806 u[IU]/mL (ref 0.350–4.500)

## 2023-07-11 LAB — URINALYSIS, ROUTINE W REFLEX MICROSCOPIC
Bacteria, UA: NONE SEEN
Bilirubin Urine: NEGATIVE
Glucose, UA: NEGATIVE mg/dL
Ketones, ur: NEGATIVE mg/dL
Leukocytes,Ua: NEGATIVE
Nitrite: NEGATIVE
Protein, ur: NEGATIVE mg/dL
Specific Gravity, Urine: 1.012 (ref 1.005–1.030)
Squamous Epithelial / HPF: NONE SEEN /HPF (ref 0–5)
pH: 6 (ref 5.0–8.0)

## 2023-07-11 LAB — CHLAMYDIA/NGC RT PCR (ARMC ONLY)
Chlamydia Tr: NOT DETECTED
N gonorrhoeae: NOT DETECTED

## 2023-07-11 LAB — BASIC METABOLIC PANEL
Anion gap: 8 (ref 5–15)
BUN: 9 mg/dL (ref 6–20)
CO2: 25 mmol/L (ref 22–32)
Calcium: 8.9 mg/dL (ref 8.9–10.3)
Chloride: 104 mmol/L (ref 98–111)
Creatinine, Ser: 1.05 mg/dL (ref 0.61–1.24)
GFR, Estimated: >60 mL/min (ref >60)
Glucose, Bld: 127 mg/dL — ABNORMAL HIGH (ref 70–99)
Potassium: 3.5 mmol/L (ref 3.5–5.1)
Sodium: 137 mmol/L (ref 135–145)

## 2023-07-11 MED ORDER — CEFTRIAXONE SODIUM 1 G IJ SOLR
500.0000 mg | Freq: Once | INTRAMUSCULAR | Status: AC
  Administered 2023-07-11: 500 mg via INTRAMUSCULAR
  Filled 2023-07-11: qty 10

## 2023-07-11 MED ORDER — AZITHROMYCIN 500 MG PO TABS
1000.0000 mg | ORAL_TABLET | Freq: Once | ORAL | Status: AC
  Administered 2023-07-11: 1000 mg via ORAL
  Filled 2023-07-11: qty 2

## 2023-07-11 MED ORDER — CIPROFLOXACIN HCL 500 MG PO TABS: 500.0000 mg | ORAL_TABLET | Freq: Two times a day (BID) | ORAL | 0 refills | Status: AC

## 2023-07-11 MED ORDER — LIDOCAINE HCL (PF) 1 % IJ SOLN
5.0000 mL | Freq: Once | INTRAMUSCULAR | Status: AC
  Administered 2023-07-11: 5 mL

## 2023-07-11 NOTE — ED Notes (Signed)
Patient made aware of need for urine specimen; states he just went prior to triage.  Labeled specimen cup provided to patient; asked to bring to RN desk once he was able to provide a specimen.  Patient expresses verbal understanding w/ no further questions at this time.

## 2023-07-11 NOTE — ED Provider Notes (Signed)
[facility] Provider Note    Event Date/Time   First MD Initiated Contact with Patient 07/11/23 1654     (approximate)   History   Hematuria   HPI  Steven Kirk is a 47 y.o. male with history of hypertension and CAD presents emergency department with complaints of hematuria this morning.  Patient states he often has some burning along the rectum and pain when he sits.  Also concerns for STDs he had unprotected sex.  No fever or chills.  No vomiting diarrhea.  States he often has weak muscles and is unsure why.  Does not have a PCP      Physical Exam   Triage Vital Signs: ED Triage Vitals  Encounter Vitals Group     BP 07/11/23 1516 114/77     Systolic BP Percentile --      Diastolic BP Percentile --      Pulse Rate 07/11/23 1516 79     Resp 07/11/23 1516 15     Temp 07/11/23 1516 98.7 F (37.1 C)     Temp Source 07/11/23 1516 Oral     SpO2 07/11/23 1516 95 %     Weight 07/11/23 1509 175 lb (79.4 kg)     Height 07/11/23 1509 5\' 10"  (1.778 m)     Head Circumference --      Peak Flow --      Pain Score 07/11/23 1509 4     Pain Loc --      Pain Education --      Exclude from Growth Chart --     Most recent vital signs: Vitals:   07/11/23 1516  BP: 114/77  Pulse: 79  Resp: 15  Temp: 98.7 F (37.1 C)  SpO2: 95%     General: Awake, no distress.   CV:  Good peripheral perfusion. regular rate and  rhythm Resp:  Normal effort. Lungs cta Abd:  No distention.  Minimally tender all 4 quads Other:     ED Results / Procedures / Treatments   Labs (all labs ordered are listed, but only abnormal results are displayed) Labs Reviewed  URINALYSIS, ROUTINE W REFLEX MICROSCOPIC - Abnormal; Notable for the following components:      Result Value   Color, Urine YELLOW (*)    APPearance CLEAR (*)    Hgb urine dipstick MODERATE (*)    All other components within normal limits  BASIC METABOLIC PANEL - Abnormal; Notable for the following  components:   Glucose, Bld 127 (*)    All other components within normal limits  CBC - Abnormal; Notable for the following components:   RBC 4.18 (*)    HCT 38.7 (*)    All other components within normal limits  CHLAMYDIA/NGC RT PCR ([facility] ONLY)            TSH     EKG     RADIOLOGY CT renal stone    PROCEDURES:   Procedures   MEDICATIONS ORDERED IN ED: Medications  cefTRIAXone (ROCEPHIN) injection 500 mg (has no administration in time range)  azithromycin (ZITHROMAX) tablet 1,000 mg (has no administration in time range)     IMPRESSION / MDM / ASSESSMENT AND PLAN / ED COURSE  I reviewed the triage vital signs and the nursing notes.                              Differential diagnosis includes, but  is not limited to, STD, prostatitis, kidney stone, pyelonephritis  Patient's presentation is most consistent with acute illness / injury with system symptoms.   CBC metabolic panel and urinalysis reassuring  Due to the patient's fatigue and weakness I did add a TSH, GC/chlamydia also added  Will go ahead and empirically treat the patient for gonorrhea and chlamydia.  Will also treat him for prostatitis.  He is to follow-up with his regular urologist.  Follow-up with a PCP.  He was given a list of PCP options.  Go to the health department if his gonorrhea and chlamydia are positive.  Patient is in agreement treatment plan.  Discharged stable condition.  TSH and GC/chlamydia are reassuring.  Did call the patient and notify of the results.     FINAL CLINICAL IMPRESSION(S) / ED DIAGNOSES   Final diagnoses:  Prostatitis, unspecified prostatitis type  STD exposure     Rx / DC Orders   ED Discharge Orders     None        Note:  This document was prepared using Dragon voice recognition software and may include unintentional dictation errors.    Faythe Ghee, PA-C 07/11/23 2016    Phineas Semen, MD 07/12/23 [phone]

## 2023-07-11 NOTE — ED Notes (Signed)
See triage notes. Patient complains of sudden onset of urinary urgency and lower back pain.

## 2023-07-11 NOTE — ED Triage Notes (Signed)
Pt in via POV, reports have sudden onset of urinary urgency and frequency today, also reports noticing some blood in his urine.  Does report some left sided flank pain as well.  Denies any hx of kidney stones.   Ambulatory to triage; NAD noted at this time.

## 2023-07-11 NOTE — Discharge Instructions (Addendum)

## 2023-09-04 ENCOUNTER — Ambulatory Visit: Payer: BLUE CROSS/BLUE SHIELD | Attending: Pediatrics

## 2023-09-04 ENCOUNTER — Encounter: Payer: Self-pay | Admitting: Pediatrics

## 2023-09-04 ENCOUNTER — Ambulatory Visit: Payer: BLUE CROSS/BLUE SHIELD | Admitting: Pediatrics

## 2023-09-04 VITALS — BP 126/67 | HR 64 | Temp 98.4°F | Ht 70.0 in | Wt 177.2 lb

## 2023-09-04 DIAGNOSIS — Z131 Encounter for screening for diabetes mellitus: Secondary | ICD-10-CM

## 2023-09-04 DIAGNOSIS — R002 Palpitations: Secondary | ICD-10-CM

## 2023-09-04 DIAGNOSIS — R5382 Chronic fatigue, unspecified: Secondary | ICD-10-CM | POA: Diagnosis not present

## 2023-09-04 DIAGNOSIS — Z1159 Encounter for screening for other viral diseases: Secondary | ICD-10-CM

## 2023-09-04 DIAGNOSIS — M25551 Pain in right hip: Secondary | ICD-10-CM

## 2023-09-04 DIAGNOSIS — Z1211 Encounter for screening for malignant neoplasm of colon: Secondary | ICD-10-CM

## 2023-09-04 DIAGNOSIS — I2510 Atherosclerotic heart disease of native coronary artery without angina pectoris: Secondary | ICD-10-CM

## 2023-09-04 DIAGNOSIS — Z7689 Persons encountering health services in other specified circumstances: Secondary | ICD-10-CM

## 2023-09-04 DIAGNOSIS — R5381 Other malaise: Secondary | ICD-10-CM

## 2023-09-04 DIAGNOSIS — Z13220 Encounter for screening for lipoid disorders: Secondary | ICD-10-CM

## 2023-09-04 DIAGNOSIS — Z114 Encounter for screening for human immunodeficiency virus [HIV]: Secondary | ICD-10-CM

## 2023-09-04 NOTE — Assessment & Plan Note (Deleted)
Patient previously on

## 2023-09-04 NOTE — Assessment & Plan Note (Addendum)
S/p inferior STEMI. Previously followed by cardiology. Previously recommended DAPT with aspirin and clopidogrel for minimum of 12 months, continued on atorvastatin 80 mg daily thereafter. Additionally on Coreg 3.125 mg twice daily and losartan 50 mg daily for HTN though he is normotensive today. He does not want to restart medications but amenable to seeing cardiology again. Will discuss at least start statin after labs completed today.

## 2023-09-04 NOTE — Patient Instructions (Addendum)
We will start with labs. It is important to restart your heart medications and follow up with your cardiologist.  Please stop by [facility] on Monday across from [place] to get the x ray of your hip.  For your heart racing: will start with a holter, this will come to your house with instructions on how to hook it up.  For colon cancer screen: we will call to schedule your colonoscopy  I will see you in 1 month for you physical.

## 2023-09-04 NOTE — Progress Notes (Signed)
Establish Care Note  BP 126/67   Pulse 64   Temp 98.4 F (36.9 C) (Oral)   Ht 5\' 10"  (1.778 m)   Wt 177 lb 3.2 oz (80.4 kg)   SpO2 96%   BMI 25.43 kg/m    Subjective:    Patient ID: Steven Kirk, male    DOB: 09/22/1976, 47 y.o.   MRN: [id number]  HPI: Steven Kirk is a 47 y.o. male  Chief Complaint  Patient presents with   Hip Pain   R Hip Pain Acute on chronic. He reports that this is his main concern He was previously active now limits activity Everytime he walks he has to limp now Feels it locks out of place He had imaging in the past but never heard what to do Never did physical therapy for it. Does not take any medication for it. He reports when it locks badly, he has numbness and feels unstable Has had numbness  Other reported concerns: - has felt diffuse pain throughout his body, feels fatigued all the time - he feels short of breath, heart racing - had MI a year ago, stopped medications bc he "is too young to take medications" - he has had abdominal pain for years and is concerned about cancer - he has low mood, not interested in treatment. Constant worry and irritable - no SI/HI - Reports unintended weight loss of >30lbs unsure for how long, can't keep weight up  He is a smoker of cigarettes and marijuana. Does not drink alcohol  Relevant past medical, surgical, family and social history reviewed and updated as indicated. Interim medical history since our last visit reviewed. Allergies and medications reviewed and updated.  ROS per HPI unless specifically indicated above     Objective:    BP 126/67   Pulse 64   Temp 98.4 F (36.9 C) (Oral)   Ht 5\' 10"  (1.778 m)   Wt 177 lb 3.2 oz (80.4 kg)   SpO2 96%   BMI 25.43 kg/m   Wt Readings from Last 3 Encounters:  09/04/23 177 lb 3.2 oz (80.4 kg)  07/11/23 175 lb (79.4 kg)  07/02/23 173 lb 8 oz (78.7 kg)    Physical Exam Constitutional:      Appearance: Normal appearance.  HENT:      Head: Normocephalic and atraumatic.  Eyes:     Pupils: Pupils are equal, round, and reactive to light.  Cardiovascular:     Rate and Rhythm: Normal rate and regular rhythm.     Pulses: Normal pulses.     Heart sounds: Normal heart sounds.  Pulmonary:     Effort: Pulmonary effort is normal.     Breath sounds: Normal breath sounds.  Abdominal:     General: Abdomen is flat.     Palpations: Abdomen is soft.  Musculoskeletal:     Cervical back: Normal range of motion.     Right hip: Tenderness and crepitus present. Decreased range of motion. Decreased strength.     Left hip: Normal.  Skin:    General: Skin is warm and dry.     Capillary Refill: Capillary refill takes less than 2 seconds.  Neurological:     General: No focal deficit present.     Mental Status: He is alert. Mental status is at baseline.  Psychiatric:        Mood and Affect: Mood normal.        Behavior: Behavior normal.  Assessment & Plan:  Assessment & Plan   Pain of right hip -     DG HIP UNILAT W OR W/O PELVIS 2-3 VIEWS RIGHT; Future  Atherosclerosis of coronary artery without angina pectoris, unspecified vessel or lesion type, unspecified whether native or transplanted heart Assessment & Plan: S/p inferior STEMI. Previously followed by cardiology. Previously recommended DAPT with aspirin and clopidogrel for minimum of 12 months, continued on atorvastatin 80 mg daily thereafter. Additionally on Coreg 3.125 mg twice daily and losartan 50 mg daily for HTN though he is normotensive today. He does not want to restart medications but amenable to seeing cardiology again. Will discuss at least start statin after labs completed today.  Chronic fatigue and malaise Unclear if related to underlying mood issues and/or cardiac hx. Plan to start with blood work.  -     Comprehensive metabolic panel -     CBC with Differential/Platelet -     Iron, TIBC and Ferritin Panel -     Folate -     Vitamin  B12  Palpitations Mentioned in brief, will send holter and check above labs. Pt given strict return precautions and anticipatory guidance for ED. -     LONG TERM MONITOR (3-14 DAYS); Future -     Magnesium  Lipid screening -     Lipid panel  Diabetes mellitus screening -     Hemoglobin A1c  Screen for colon cancer -     Ambulatory referral to Gastroenterology  Need for hepatitis C screening test -     Hepatitis C antibody  Encounter for screening for HIV -     HIV Antibody (routine testing w rflx)  Establish care Reviewed patient record including history, medications, problem list. Will schedule for physical.    There are other unrelated non-urgent complaints, but due to the busy schedule and the amount of time I've already spent with him, time does not permit me to address these routine issues at today's visit. I've requested another appointment to review these additional issues.  Follow up plan: Return in about 4 weeks (around 10/02/2023) for Physical.  Bruce Churilla Howell Pringle, MD

## 2023-09-05 LAB — CBC WITH DIFFERENTIAL/PLATELET
Basophils Absolute: 0 10*3/uL (ref 0.0–0.2)
Basos: 0 %
EOS (ABSOLUTE): 0.1 10*3/uL (ref 0.0–0.4)
Eos: 1 %
Hematocrit: 42.9 % (ref 37.5–51.0)
Hemoglobin: 14.2 g/dL (ref 13.0–17.7)
Immature Grans (Abs): 0 10*3/uL (ref 0.0–0.1)
Immature Granulocytes: 0 %
Lymphocytes Absolute: 1.7 10*3/uL (ref 0.7–3.1)
Lymphs: 25 %
MCH: 32.1 pg (ref 26.6–33.0)
MCHC: 33.1 g/dL (ref 31.5–35.7)
MCV: 97 fL (ref 79–97)
Monocytes Absolute: 0.5 10*3/uL (ref 0.1–0.9)
Monocytes: 7 %
Neutrophils Absolute: 4.5 10*3/uL (ref 1.4–7.0)
Neutrophils: 67 %
Platelets: 212 10*3/uL (ref 150–450)
RBC: 4.42 x10E6/uL (ref 4.14–5.80)
RDW: 12.4 % (ref 11.6–15.4)
WBC: 6.8 10*3/uL (ref 3.4–10.8)

## 2023-09-05 LAB — COMPREHENSIVE METABOLIC PANEL
ALT: 11 IU/L (ref 0–44)
AST: 18 IU/L (ref 0–40)
Albumin: 4.6 g/dL (ref 4.1–5.1)
Alkaline Phosphatase: 70 IU/L (ref 44–121)
BUN/Creatinine Ratio: 12 (ref 9–20)
BUN: 13 mg/dL (ref 6–24)
Bilirubin Total: 0.2 mg/dL (ref 0.0–1.2)
CO2: 25 mmol/L (ref 20–29)
Calcium: 9.5 mg/dL (ref 8.7–10.2)
Chloride: 104 mmol/L (ref 96–106)
Creatinine, Ser: 1.09 mg/dL (ref 0.76–1.27)
Globulin, Total: 2.5 g/dL (ref 1.5–4.5)
Glucose: 97 mg/dL (ref 70–99)
Potassium: 3.9 mmol/L (ref 3.5–5.2)
Sodium: 146 mmol/L — ABNORMAL HIGH (ref 134–144)
Total Protein: 7.1 g/dL (ref 6.0–8.5)
eGFR: 83 mL/min/{1.73_m2} (ref >59)

## 2023-09-05 LAB — IRON,TIBC AND FERRITIN PANEL
Ferritin: 237 ng/mL (ref 30–400)
Iron Saturation: 28 % (ref 15–55)
Iron: 77 ug/dL (ref 38–169)
Total Iron Binding Capacity: 274 ug/dL (ref 250–450)
UIBC: 197 ug/dL (ref 111–343)

## 2023-09-05 LAB — HEMOGLOBIN A1C
Est. average glucose Bld gHb Est-mCnc: 111 mg/dL
Hgb A1c MFr Bld: 5.5 % (ref 4.8–5.6)

## 2023-09-05 LAB — LIPID PANEL
Chol/HDL Ratio: 2.5 ratio (ref 0.0–5.0)
Cholesterol, Total: 148 mg/dL (ref 100–199)
HDL: 59 mg/dL (ref >39)
LDL Chol Calc (NIH): 61 mg/dL (ref 0–99)
Triglycerides: 168 mg/dL — ABNORMAL HIGH (ref 0–149)
VLDL Cholesterol Cal: 28 mg/dL (ref 5–40)

## 2023-09-05 LAB — VITAMIN B12: Vitamin B-12: 459 pg/mL (ref 232–1245)

## 2023-09-05 LAB — MAGNESIUM: Magnesium: 2.1 mg/dL (ref 1.6–2.3)

## 2023-09-05 LAB — HIV ANTIBODY (ROUTINE TESTING W REFLEX): HIV Screen 4th Generation wRfx: NONREACTIVE

## 2023-09-05 LAB — FOLATE: Folate: 8.5 ng/mL (ref >3.0)

## 2023-09-05 LAB — HEPATITIS C ANTIBODY: Hep C Virus Ab: NONREACTIVE

## 2023-09-07 ENCOUNTER — Ambulatory Visit
Admission: RE | Admit: 2023-09-07 | Discharge: 2023-09-07 | Disposition: A | Discharge status: Home / Self Care | Payer: BLUE CROSS/BLUE SHIELD | Attending: Pediatrics | Admitting: Pediatrics

## 2023-09-07 ENCOUNTER — Ambulatory Visit
Admission: RE | Admit: 2023-09-07 | Discharge: 2023-09-07 | Disposition: A | Discharge status: Home / Self Care | Payer: BLUE CROSS/BLUE SHIELD | Source: Ambulatory Visit | Attending: Pediatrics | Admitting: Pediatrics

## 2023-09-07 DIAGNOSIS — M25551 Pain in right hip: Secondary | ICD-10-CM | POA: Diagnosis present

## 2023-09-17 ENCOUNTER — Telehealth: Payer: Self-pay | Admitting: *Deleted

## 2023-09-17 NOTE — Telephone Encounter (Signed)
okay

## 2023-09-17 NOTE — Telephone Encounter (Signed)
I have spoken to patient regarding his referral for colonoscopy. However, this patient also have another referral for rectal pain that was sent on 07/02/2023. Please call patient for a new patient appointment.

## 2023-09-22 ENCOUNTER — Telehealth: Payer: Self-pay | Admitting: *Deleted

## 2023-09-22 ENCOUNTER — Ambulatory Visit: Payer: BLUE CROSS/BLUE SHIELD | Attending: Cardiology | Admitting: Cardiology

## 2023-09-22 ENCOUNTER — Other Ambulatory Visit: Payer: Self-pay | Admitting: Pediatrics

## 2023-09-22 DIAGNOSIS — M1610 Unilateral primary osteoarthritis, unspecified hip: Secondary | ICD-10-CM

## 2023-09-22 NOTE — Telephone Encounter (Signed)
This was sent to our queue again.  On 09/17/2023 I have spoken to patient regarding his referral for colonoscopy. However, this patient also have another referral for rectal pain that was sent on 07/02/2023. Please call patient for a new patient appointment.

## 2023-09-22 NOTE — Progress Notes (Deleted)
  Cardiology Office Note:  .   Date:  09/22/2023  ID:  Jearld Lesch, DOB 06/24/1976, MRN [id number] PCP: Jackolyn Confer, MD  [facility] Providers Cardiologist:  Lorine Bears, MD { Click to update primary MD,subspecialty MD or APP then REFRESH:1}   History of Present Illness: .   Jaicion Dewayne Michalowski is a 47 y.o. male ***  ROS: ***  Studies Reviewed: .        *** Risk Assessment/Calculations:   {Does this patient have ATRIAL FIBRILLATION?[phone]} No BP recorded.  {Refresh Note OR Click here to enter BP  :1}***       Physical Exam:   VS:  There were no vitals taken for this visit.   Wt Readings from Last 3 Encounters:  09/04/23 177 lb 3.2 oz (80.4 kg)  07/11/23 175 lb (79.4 kg)  07/02/23 173 lb 8 oz (78.7 kg)    GEN: Well nourished, well developed in no acute distress NECK: No JVD; No carotid bruits CARDIAC: ***RRR, no murmurs, rubs, gallops RESPIRATORY:  Clear to auscultation without rales, wheezing or rhonchi  ABDOMEN: Soft, non-tender, non-distended EXTREMITIES:  No edema; No deformity   ASSESSMENT AND PLAN: .   ***    {Are you ordering a CV Procedure (e.g. stress test, cath, DCCV, TEE, etc)?   Press F2        :[id number]}  Dispo: ***  Signed, Lilli Dewald, NP

## 2023-10-01 ENCOUNTER — Telehealth: Payer: Self-pay

## 2023-10-01 NOTE — Telephone Encounter (Signed)
Called patient to schedule office visit and there was no answer I left a voicemail to call back.

## 2023-10-06 ENCOUNTER — Encounter: Payer: BLUE CROSS/BLUE SHIELD | Admitting: Pediatrics

## 2023-10-06 NOTE — Progress Notes (Deleted)
There were no vitals taken for this visit.   Annual Physical Exam - Male  Subjective:   CC: No chief complaint on file.   Steven Kirk is a 47 y.o. male patient here for a preventative health maintenance exam{Blank Single :19197::" and has no acute complaints",". Additional topics discussed include:"}  Smoking Smokes 1 pack per day   Health Habits: DIET: {Desc; diets:16563} EXERCISE: *** times/week on average, activities include {misc; exercise types:16438} DENTAL EXAM: {Status:51375} EYE EXAM: {Status:51375}                              Sex History:  Sexually active : {CHL AMB SEXUALLY ACTIVE:210950101} Barrier protection : {barrier protection:48916}       No family history on file.                    Social History   Tobacco Use   Smoking status: Every Day    Current packs/day: 1.00    Average packs/day: 1 pack/day for 21.3 years (21.3 ttl pk-yrs)    Types: Cigarettes    Start date: 07/01/2022   Smokeless tobacco: Never  Vaping Use   Vaping status: Never Used  Substance Use Topics   Alcohol use: Not Currently   Drug use: Yes    Types: Marijuana   Social History   Social History Narrative   Not on file    Social drivers questionnaire is reviewed and is positive for : {SDOH screen:50704}. Follow up: {sdoh follow up:67204::"None"} {Reminder- only displays positive results. Screens housing/financial/transportation/food. Other older screens may be included. F2 through for info and options. This will delete.:67203}  Depression Screening:     09/04/2023    3:09 PM  Depression screen PHQ 2/9  Decreased Interest 3  Down, Depressed, Hopeless 1  PHQ - 2 Score 4  Altered sleeping 3  Tired, decreased energy 2  Change in appetite 2  Feeling bad or failure about yourself  0  Trouble concentrating 1  Moving slowly or fidgety/restless 1  Suicidal thoughts 0  PHQ-9 Score 13  Difficult doing work/chores Somewhat difficult        09/04/2023    3:09 PM   GAD 7 : Generalized Anxiety Score  Nervous, Anxious, on Edge 3  Control/stop worrying 3  Worry too much - different things 1  Trouble relaxing 1  Restless 1  Easily annoyed or irritable 2  Afraid - awful might happen 0  Total GAD 7 Score 11  Anxiety Difficulty Not difficult at all      Depression Severity and Treatment Recommendations:  0-4= None  5-9= Mild / Treatment: Support, educate to call if worse; return in one month  10-14= Moderate / Treatment: Support, watchful waiting; Antidepressant or Psychotherapy  15-19= Moderately severe / Treatment: Antidepressant OR Psychotherapy  >= 20= Major depression, severe / Antidepressant AND Psychotherapy  Mental Health Plan: {Plan:7084644779}  Health Maintenance Colon Cancer Screening : {mpsduenotdue:31082} Prostate Cancer Screening :  {mpsduenotdue:31082} Immunizations : {Immunizations:5306}  Self Management Goals  Goals   None     Review of Systems Per HPI    Current Outpatient Medications (Cardiovascular):    atorvastatin (LIPITOR) 80 MG tablet, Take 1 tablet (80 mg total) by mouth daily. (Patient not taking: Reported on 09/04/2023)   carvedilol (COREG) 3.125 MG tablet, Take 1 tablet (3.125 mg total) by mouth 2 (two) times daily with a meal. (Patient not taking: Reported on 09/04/2023)  losartan (COZAAR) 50 MG tablet, Take 1 tablet (50 mg total) by mouth daily. (Patient not taking: Reported on 09/04/2023)   nitroGLYCERIN (NITROSTAT) 0.4 MG SL tablet, Place 1 tablet (0.4 mg total) under the tongue every 5 (five) minutes as needed for chest pain. (Patient not taking: Reported on 07/02/2023)   Current Outpatient Medications (Analgesics):    aspirin 81 MG chewable tablet, Chew 1 tablet (81 mg total) by mouth daily. (Patient not taking: Reported on 09/04/2023)  Current Outpatient Medications (Hematological):    clopidogrel (PLAVIX) 75 MG tablet, Take 1 tablet (75 mg total) by mouth daily with breakfast. (Patient not taking:  Reported on 09/04/2023)    Patient Active Problem List   Diagnosis Date Noted   Hyperlipidemia 08/11/2022   Tobacco use 08/11/2022   Marijuana use 08/11/2022   Primary hypertension 08/11/2022   CAD (coronary atherosclerotic disease) 08/10/2022     Objective:   There were no vitals filed for this visit.  Physical Exam ***  Assessment and Plan:   Atherosclerosis of coronary artery without angina pectoris, unspecified vessel or lesion type, unspecified whether native or transplanted heart  Annual physical exam  Encounter for behavioral health screening    This plan was discussed with the patient and questions were answered. There were no further concerns.  Follow up as indicated, or sooner should any new problems arise, if conditions worsen, or if they are otherwise concerned.   See patient instructions for additional information.  Angla Delahunt Howell Pringle, MD Family Medicine       Future Appointments  Date Time Provider Department Center  10/06/2023  3:40 PM Jackolyn Confer, MD [facility]

## 2023-11-04 ENCOUNTER — Ambulatory Visit: Payer: BLUE CROSS/BLUE SHIELD | Admitting: Physician Assistant

## 2023-12-16 NOTE — Progress Notes (Deleted)
 Ellouise Console, PA-C [address]  [address]  [place], [place] [address]  Main: [phone]  Fax: [phone]   Gastroenterology Consultation  Referring Provider:     Herold Hadassah SQUIBB, MD Primary Care Physician:  Herold Hadassah SQUIBB, MD Primary Gastroenterologist:  *** Reason for Consultation:     Rectal Pain        HPI:   Steven Kirk is a 48 y.o. y/o male referred for consultation & management  by Herold Hadassah SQUIBB, MD.    Referred to evaluate rectal pain.  No previous GI evaluation or colonoscopy.  Family history of colon cancer?  08/2023 lab: Normal hemoglobin 14.2, no anemia.  Normal CMP, and iron panel.  06/2023 abdominal pelvic CT without contrast showed no acute abnormality.  Was treated for prostatitis.  Past Medical History:  Diagnosis Date   Coronary artery disease    Hypertension     Past Surgical History:  Procedure Laterality Date   CORONARY/GRAFT ACUTE MI REVASCULARIZATION N/A 08/10/2022   Procedure: Coronary/Graft Acute MI Revascularization;  Surgeon: Darron Deatrice LABOR, MD;  Location: [facility] INVASIVE CV LAB;  Service: Cardiovascular;  Laterality: N/A;   KNEE SURGERY     LEFT HEART CATH AND CORONARY ANGIOGRAPHY N/A 08/10/2022   Procedure: LEFT HEART CATH AND CORONARY ANGIOGRAPHY;  Surgeon: Darron Deatrice LABOR, MD;  Location: [facility] INVASIVE CV LAB;  Service: Cardiovascular;  Laterality: N/A;    Prior to Admission medications   Medication Sig Start Date End Date Taking? Authorizing Provider  aspirin  81 MG chewable tablet Chew 1 tablet (81 mg total) by mouth daily. Patient not taking: Reported on 09/04/2023 08/13/22   Krishnan, Sendil K, MD  atorvastatin  (LIPITOR) 80 MG tablet Take 1 tablet (80 mg total) by mouth daily. Patient not taking: Reported on 09/04/2023 08/13/22   Krishnan, Sendil K, MD  carvedilol  (COREG ) 3.125 MG tablet Take 1 tablet (3.125 mg total) by mouth 2 (two) times daily with a meal. Patient not taking: Reported on 09/04/2023 08/12/22    Krishnan, Sendil K, MD  clopidogrel  (PLAVIX ) 75 MG tablet Take 1 tablet (75 mg total) by mouth daily with breakfast. Patient not taking: Reported on 09/04/2023 08/13/22   Krishnan, Sendil K, MD  losartan  (COZAAR ) 50 MG tablet Take 1 tablet (50 mg total) by mouth daily. Patient not taking: Reported on 09/04/2023 08/13/22   Krishnan, Sendil K, MD  nitroGLYCERIN  (NITROSTAT ) 0.4 MG SL tablet Place 1 tablet (0.4 mg total) under the tongue every 5 (five) minutes as needed for chest pain. Patient not taking: Reported on 07/02/2023 08/21/22   Gerard Frederick, NP    No family history on file.   Social History   Tobacco Use   Smoking status: Every Day    Current packs/day: 1.00    Average packs/day: 1 pack/day for 21.5 years (21.5 ttl pk-yrs)    Types: Cigarettes    Start date: 07/01/2022   Smokeless tobacco: Never  Vaping Use   Vaping status: Never Used  Substance Use Topics   Alcohol use: Not Currently   Drug use: Yes    Types: Marijuana    Allergies as of 12/17/2023   (No Known Allergies)    Review of Systems:    All systems reviewed and negative except where noted in HPI.   Physical Exam:  There were no vitals taken for this visit. No LMP for male patient.  General:   Alert,  Well-developed, well-nourished, pleasant and cooperative in NAD Lungs:  Respirations even and unlabored.  Clear throughout to auscultation.   No wheezes, crackles, or rhonchi. No acute distress. Heart:  Regular rate and rhythm; no murmurs, clicks, rubs, or gallops. Abdomen:  Normal bowel sounds.  No bruits.  Soft, and non-distended without masses, hepatosplenomegaly or hernias noted.  No Tenderness.  No guarding or rebound tenderness.    Neurologic:  Alert and oriented x3;  grossly normal neurologically. Psych:  Alert and cooperative. Normal mood and affect.  Imaging Studies: No results found.  Assessment and Plan:   Steven Kirk is a 48 y.o. y/o male has been referred for ***  Follow up ***  Ellouise Console, PA-C    BP check ***

## 2023-12-17 ENCOUNTER — Ambulatory Visit: Payer: BLUE CROSS/BLUE SHIELD | Admitting: Physician Assistant

## 2024-01-04 ENCOUNTER — Other Ambulatory Visit: Payer: Self-pay

## 2024-01-04 ENCOUNTER — Emergency Department: Payer: No Typology Code available for payment source

## 2024-01-04 ENCOUNTER — Observation Stay: Payer: No Typology Code available for payment source

## 2024-01-04 ENCOUNTER — Emergency Department
Admission: EM | Admit: 2024-01-04 | Discharge: 2024-01-04 | Discharge status: Against Medical Advice | Payer: BLUE CROSS/BLUE SHIELD | Attending: Internal Medicine | Admitting: Internal Medicine

## 2024-01-04 DIAGNOSIS — R55 Syncope and collapse: Secondary | ICD-10-CM | POA: Diagnosis present

## 2024-01-04 DIAGNOSIS — M160 Bilateral primary osteoarthritis of hip: Secondary | ICD-10-CM | POA: Insufficient documentation

## 2024-01-04 DIAGNOSIS — I10 Essential (primary) hypertension: Secondary | ICD-10-CM | POA: Diagnosis not present

## 2024-01-04 DIAGNOSIS — I2510 Atherosclerotic heart disease of native coronary artery without angina pectoris: Secondary | ICD-10-CM | POA: Insufficient documentation

## 2024-01-04 DIAGNOSIS — Z79899 Other long term (current) drug therapy: Secondary | ICD-10-CM | POA: Diagnosis not present

## 2024-01-04 DIAGNOSIS — Z7902 Long term (current) use of antithrombotics/antiplatelets: Secondary | ICD-10-CM | POA: Diagnosis not present

## 2024-01-04 DIAGNOSIS — Z7982 Long term (current) use of aspirin: Secondary | ICD-10-CM | POA: Insufficient documentation

## 2024-01-04 DIAGNOSIS — Z951 Presence of aortocoronary bypass graft: Secondary | ICD-10-CM | POA: Diagnosis not present

## 2024-01-04 DIAGNOSIS — R410 Disorientation, unspecified: Principal | ICD-10-CM | POA: Diagnosis present

## 2024-01-04 DIAGNOSIS — F17210 Nicotine dependence, cigarettes, uncomplicated: Secondary | ICD-10-CM | POA: Insufficient documentation

## 2024-01-04 LAB — BASIC METABOLIC PANEL
Anion gap: 11 (ref 5–15)
BUN: 13 mg/dL (ref 6–20)
CO2: 23 mmol/L (ref 22–32)
Calcium: 8.9 mg/dL (ref 8.9–10.3)
Chloride: 104 mmol/L (ref 98–111)
Creatinine, Ser: 1.07 mg/dL (ref 0.61–1.24)
GFR, Estimated: >60 mL/min (ref >60)
Glucose, Bld: 107 mg/dL — ABNORMAL HIGH (ref 70–99)
Potassium: 3.9 mmol/L (ref 3.5–5.1)
Sodium: 138 mmol/L (ref 135–145)

## 2024-01-04 LAB — CBC
HCT: 40.5 % (ref 39.0–52.0)
Hemoglobin: 13.5 g/dL (ref 13.0–17.0)
MCH: 31.5 pg (ref 26.0–34.0)
MCHC: 33.3 g/dL (ref 30.0–36.0)
MCV: 94.6 fL (ref 80.0–100.0)
Platelets: 187 10*3/uL (ref 150–400)
RBC: 4.28 MIL/uL (ref 4.22–5.81)
RDW: 12.2 % (ref 11.5–15.5)
WBC: 8.2 10*3/uL (ref 4.0–10.5)
nRBC: 0 % (ref 0.0–0.2)

## 2024-01-04 LAB — TROPONIN I (HIGH SENSITIVITY)
Troponin I (High Sensitivity): 5 ng/L (ref <18)
Troponin I (High Sensitivity): 5 ng/L (ref <18)

## 2024-01-04 MED ORDER — ATORVASTATIN CALCIUM 20 MG PO TABS: 80.0000 mg | ORAL_TABLET | Freq: Every day | ORAL | Status: DC

## 2024-01-04 MED ORDER — ENOXAPARIN SODIUM 40 MG/0.4ML IJ SOSY: 40.0000 mg | PREFILLED_SYRINGE | INTRAMUSCULAR | Status: DC

## 2024-01-04 MED ORDER — ASPIRIN 81 MG PO CHEW: 81.0000 mg | CHEWABLE_TABLET | Freq: Every day | ORAL | Status: DC

## 2024-01-04 MED ORDER — ACETAMINOPHEN 325 MG PO TABS: 650.0000 mg | ORAL_TABLET | Freq: Four times a day (QID) | ORAL | Status: DC | PRN

## 2024-01-04 MED ORDER — ONDANSETRON HCL 4 MG/2ML IJ SOLN: 4.0000 mg | Freq: Four times a day (QID) | INTRAMUSCULAR | Status: DC | PRN

## 2024-01-04 MED ORDER — ONDANSETRON HCL 4 MG PO TABS: 4.0000 mg | ORAL_TABLET | Freq: Four times a day (QID) | ORAL | Status: DC | PRN

## 2024-01-04 MED ORDER — POLYETHYLENE GLYCOL 3350 17 G PO PACK: 17.0000 g | PACK | Freq: Every day | ORAL | Status: DC | PRN

## 2024-01-04 MED ORDER — LORAZEPAM 2 MG/ML IJ SOLN: 2.0000 mg | INTRAMUSCULAR | Status: DC | PRN

## 2024-01-04 MED ORDER — LORAZEPAM 2 MG/ML IJ SOLN
2.0000 mg | Freq: Once | INTRAMUSCULAR | Status: AC
  Administered 2024-01-04: 2 mg via INTRAVENOUS
  Filled 2024-01-04: qty 1

## 2024-01-04 MED ORDER — ACETAMINOPHEN 325 MG RE SUPP: 650.0000 mg | Freq: Four times a day (QID) | RECTAL | Status: DC | PRN

## 2024-01-04 NOTE — Progress Notes (Signed)
    Against Medical Advice   Steven Kirk expresses desire to leave the Hospital immediately. Patient has been warned that this is not medically advisable at this time, and can result in medical complications like Death and Disability. Patient understands and accepts the risks involved and assumes full responsibilty of this decision. Patient's sister is present at bedside and reports that she will be driving him home.  This patient has also been advised that if they feel the need for further medical assistance to return to any available ER or dial 9-1-1.  Informed by Nursing staff that this patient has left care and has signed the form  Against Medical Advice on 01/04/2024 at 2245.  Jahne Krukowski Lamin Geradine Girt, MSN, APRN, AGACNP-BC [facility] [place] Pager: [phone]. Check Amion for Availability

## 2024-01-04 NOTE — ED Notes (Signed)
Geradine Girt, NP came to see the patient face to face and explain the risks of leaving at this time. Patient still wants to leave. Verified sister at bedside was going to be patients ride home by this RN and NP. Patient signed electronic AMA form. Patient currently stable. A&Ox4. Ambulatory to lobby with sister at this time.
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
## 2024-01-04 NOTE — ED Notes (Signed)
Pt daughter left, number in the chart if needed. States she will be back later. Pt bed alarm on, side rails up, oxygen at bedside, suction at bedside

## 2024-01-04 NOTE — ED Notes (Signed)
Pt at MRI
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
## 2024-01-04 NOTE — ED Notes (Signed)
Patient continuously making comments to family about wanting to leave. Patient stated to this RN he wants to leave while family at bedside want him to stay. RN informed patient of why they would like to keep him and observe him at this time and if he did want to leave he would have to sign out against medical advice but first would have to speak with Erie Noe, Consulting civil engineer and Geradine Girt, NP. Both were informed at this time. Patient has been A&Ox4 and neuro check is WDL.
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
## 2024-01-04 NOTE — ED Notes (Signed)
Pt was found wondering to main side of ED. Per pt family at bedside pt stated he felt like he was having an 'episode', pt family member stated that pt gait appeared to be "as if he had forgot how to walk". Pt then walked out of the room and was found on the main side of the ER in another room, pt returned to his room area and was found diaphoretic and confused. ERP at bedside, family member at bedside.

## 2024-08-25 ENCOUNTER — Emergency Department: Admission: EM | Admit: 2024-08-25 | Discharge: 2024-08-25 | Disposition: A | Discharge status: Home / Self Care

## 2024-08-25 ENCOUNTER — Emergency Department

## 2024-08-25 DIAGNOSIS — I10 Essential (primary) hypertension: Secondary | ICD-10-CM | POA: Diagnosis not present

## 2024-08-25 DIAGNOSIS — F17200 Nicotine dependence, unspecified, uncomplicated: Secondary | ICD-10-CM | POA: Insufficient documentation

## 2024-08-25 DIAGNOSIS — N179 Acute kidney failure, unspecified: Secondary | ICD-10-CM | POA: Diagnosis not present

## 2024-08-25 DIAGNOSIS — R569 Unspecified convulsions: Secondary | ICD-10-CM | POA: Diagnosis present

## 2024-08-25 LAB — CBC WITH DIFFERENTIAL/PLATELET
Abs Immature Granulocytes: 0.04 K/uL (ref 0.00–0.07)
Basophils Absolute: 0 K/uL (ref 0.0–0.1)
Basophils Relative: 0 %
Eosinophils Absolute: 0.1 K/uL (ref 0.0–0.5)
Eosinophils Relative: 2 %
HCT: 43.6 % (ref 39.0–52.0)
Hemoglobin: 14.2 g/dL (ref 13.0–17.0)
Immature Granulocytes: 1 %
Lymphocytes Relative: 43 %
Lymphs Abs: 2 K/uL (ref 0.7–4.0)
MCH: 30.4 pg (ref 26.0–34.0)
MCHC: 32.6 g/dL (ref 30.0–36.0)
MCV: 93.4 fL (ref 80.0–100.0)
Monocytes Absolute: 0.4 K/uL (ref 0.1–1.0)
Monocytes Relative: 8 %
Neutro Abs: 2.2 K/uL (ref 1.7–7.7)
Neutrophils Relative %: 46 %
Platelets: 183 K/uL (ref 150–400)
RBC: 4.67 MIL/uL (ref 4.22–5.81)
RDW: 12 % (ref 11.5–15.5)
WBC: 4.8 K/uL (ref 4.0–10.5)
nRBC: 0 % (ref 0.0–0.2)

## 2024-08-25 LAB — BASIC METABOLIC PANEL WITH GFR
Anion gap: 16 — ABNORMAL HIGH (ref 5–15)
BUN: 10 mg/dL (ref 6–20)
CO2: 19 mmol/L — ABNORMAL LOW (ref 22–32)
Calcium: 9.3 mg/dL (ref 8.9–10.3)
Chloride: 104 mmol/L (ref 98–111)
Creatinine, Ser: 1.25 mg/dL — ABNORMAL HIGH (ref 0.61–1.24)
GFR, Estimated: >60 mL/min (ref >60)
Glucose, Bld: 101 mg/dL — ABNORMAL HIGH (ref 70–99)
Potassium: 3.6 mmol/L (ref 3.5–5.1)
Sodium: 139 mmol/L (ref 135–145)

## 2024-08-25 LAB — URINE DRUG SCREEN, QUALITATIVE (ARMC ONLY)
Amphetamines, Ur Screen: NOT DETECTED
Barbiturates, Ur Screen: NOT DETECTED
Benzodiazepine, Ur Scrn: NOT DETECTED
Cannabinoid 50 Ng, Ur ~~LOC~~: POSITIVE — AB
Cocaine Metabolite,Ur ~~LOC~~: NOT DETECTED
MDMA (Ecstasy)Ur Screen: NOT DETECTED
Methadone Scn, Ur: NOT DETECTED
Opiate, Ur Screen: NOT DETECTED
Phencyclidine (PCP) Ur S: NOT DETECTED
Tricyclic, Ur Screen: NOT DETECTED

## 2024-08-25 NOTE — Discharge Instructions (Addendum)
 Your evaluation in the emergency department is overall reassuring.  I am unsure why you had a seizure episode today, but I do recommend you follow-up with your primary care doctor for reevaluation and I also placed a referral for you to follow-up with a neurologist--they will contact you to schedule an appointment.  You are at risk of having another seizure, so it is importantly do not drive or do any other activities that would be dangerous if you were to have a seizure (riding a bicycle, swimming, etc.).  I would avoid any marijuana or alcohol in the meantime, as these could trigger further seizures.  Return to the emergency department with any new or worsening symptoms.

## 2024-08-25 NOTE — ED Provider Notes (Signed)
 [facility] Provider Note    Event Date/Time   First MD Initiated Contact with Patient 08/25/24 1820     (approximate)   History   Seizures  Pt BIB [facility] for a witnessed seizure lasting last than 1 min per nephew. Patient denies history of seizures. Patient alert and oriented at triage with amnesia to the events leading to visit.    HPI Steven Kirk is a 48 y.o. male PMH marijuana use, hypertension, tobacco use, hyperlipidemia presents for evaluation of seizure - Per EMS, patient had witnessed seizure lasting less than 1 minute.  Was confused with them throughout the ambulance ride.  Glucose normal.  No known trauma. -Patient has no complaints at time of my eval.  Does not remember exactly what he was doing when this occurred, notes he had recently gotten off of work.  Denies any seizure history.  No recent infectious symptoms.  No recent trauma.  No headache.  Asymptomatic.  Denies any substance use.     Physical Exam   Triage Vital Signs: BP 127/85   Pulse 67   Temp 98 F (36.7 C) (Oral)   Resp 18   Ht 5' 9 (1.753 m)   Wt 86.2 kg   SpO2 95%   BMI 28.06 kg/m    Most recent vital signs: Vitals:   08/25/24 2030 08/25/24 2100  BP: 130/81 127/85  Pulse: 66 67  Resp: 19 18  Temp:    SpO2: 97% 95%     General: Awake, no distress.  HEENT: Normocephalic, atraumatic, no midline neck pain, no meningismus.  Abrasions to bilateral lateral tongue as well as tongue tip, no frank lacerations. CV:  Good peripheral perfusion. RRR, RP 2+ Resp:  Normal effort. CTAB Abd:  No distention. Nontender to deep palpation throughout Neuro:  Aox4, CN II-XII intact, FNF wnl, finger taps fast b/l, 5/5 strength in bilateral finger extension/grip, arm flexion/extension, EHL/FHL. BUE AG 10+ sec no drift, BLE AG 5+ sec no drift. SILT.      ED Results / Procedures / Treatments   Labs (all labs ordered are listed, but only abnormal results are displayed) Labs  Reviewed  BASIC METABOLIC PANEL WITH GFR - Abnormal; Notable for the following components:      Result Value   CO2 19 (*)    Glucose, Bld 101 (*)    Creatinine, Ser 1.25 (*)    Anion gap 16 (*)    All other components within normal limits  URINE DRUG SCREEN, QUALITATIVE ([facility] ONLY) - Abnormal; Notable for the following components:   Cannabinoid 50 Ng, Ur [place] POSITIVE (*)    All other components within normal limits  CBC WITH DIFFERENTIAL/PLATELET     EKG  Ecg = sinus rhythm, rate 70, no gross ST elevation or depression, no significant repolarization abnormality, normal axis, normal intervals.  No evidence of ischemia nor arrhythmia on my interpretation.   RADIOLOGY Radiology interpreted by myself and radiology report reviewed.  No acute pathology identified.    PROCEDURES:  Critical Care performed: No  Procedures   MEDICATIONS ORDERED IN ED: Medications - No data to display   IMPRESSION / MDM / ASSESSMENT AND PLAN / ED COURSE  I reviewed the triage vital signs and the nursing notes.                              DDX/MDM/AP: Differential diagnosis includes, but is not limited to, primary  seizure disorder, consider underlying intracranial mass or hemorrhage, consider electrolyte abnormality, consider secondary to substance use.  Less likely convulsive syncope given apparent postictal state though fortunately has returned to baseline and has a nonfocal focal neurologic exam at this time.  Plan: - Labs - CT head - EKG - Reassess  Patient's presentation is most consistent with acute presentation with potential threat to life or bodily function.  The patient is on the cardiac monitor to evaluate for evidence of arrhythmia and/or significant heart rate changes.  ED course below.   Clinical Course as of 08/25/24 2202  Thu Aug 25, 2024  1925 BMP with mild AKI and mildly low bicarb  Will give IV fluid  CBC unremarkable, no leukocytosis [MM]  1925  CTH: IMPRESSION: No acute intracranial abnormality.   [MM]  1925 CXR: IMPRESSION: No active disease.   [MM]  2201 Patient remains mentating well here, no recurrent seizures.  Workup unremarkable.  Stable for outpatient follow-up.  Neurology referral placed, also plan for PMD follow-up.  Counseled not to drive or do other activities that would be dangerous if he were to have recurrent seizure.  ED return precautions in place.  Patient agrees with plan. [MM]    Clinical Course User Index [MM] Clarine Ozell LABOR, MD     FINAL CLINICAL IMPRESSION(S) / ED DIAGNOSES   Final diagnoses:  Seizure (HCC)     Rx / DC Orders   ED Discharge Orders          Ordered    Ambulatory referral to Neurology       Comments: An appointment is requested in approximately: 2 weeks   08/25/24 2201             Note:  This document was prepared using Dragon voice recognition software and may include unintentional dictation errors.   Clarine Ozell LABOR, MD 08/25/24 2202

## 2024-08-25 NOTE — ED Triage Notes (Signed)
 Pt BIB [facility] for a witnessed seizure lasting last than 1 min per nephew. Patient denies history of seizures. Patient alert and oriented at triage with amnesia to the events leading to visit.

## 2024-08-25 NOTE — ED Notes (Signed)
 Patient provided juice and remote per request.

## 2024-09-29 ENCOUNTER — Encounter: Payer: Self-pay | Admitting: Neurology

## 2024-09-29 ENCOUNTER — Ambulatory Visit: Admitting: Neurology

## 2024-09-29 VITALS — BP 134/79 | HR 58 | Resp 16 | Ht 70.0 in

## 2024-09-29 DIAGNOSIS — R569 Unspecified convulsions: Secondary | ICD-10-CM | POA: Diagnosis not present

## 2024-09-29 NOTE — Patient Instructions (Signed)
 Routine EEG, I will contact you to go over the results.  If normal we will proceed with a 3-day ambulatory EEG Please call if you do have another event Discussed driving restriction for total of 6 months.   He voiced understanding.
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
## 2024-09-30 ENCOUNTER — Other Ambulatory Visit: Payer: Self-pay

## 2024-09-30 ENCOUNTER — Emergency Department
Admission: EM | Admit: 2024-09-30 | Discharge: 2024-09-30 | Disposition: A | Discharge status: Home / Self Care | Attending: Emergency Medicine | Admitting: Emergency Medicine

## 2024-09-30 DIAGNOSIS — R569 Unspecified convulsions: Secondary | ICD-10-CM | POA: Insufficient documentation

## 2024-09-30 DIAGNOSIS — Z87891 Personal history of nicotine dependence: Secondary | ICD-10-CM | POA: Diagnosis not present

## 2024-09-30 DIAGNOSIS — I2510 Atherosclerotic heart disease of native coronary artery without angina pectoris: Secondary | ICD-10-CM | POA: Diagnosis not present

## 2024-09-30 LAB — CBC WITH DIFFERENTIAL/PLATELET
Abs Immature Granulocytes: 0.03 K/uL (ref 0.00–0.07)
Basophils Absolute: 0 K/uL (ref 0.0–0.1)
Basophils Relative: 0 %
Eosinophils Absolute: 0.1 K/uL (ref 0.0–0.5)
Eosinophils Relative: 2 %
HCT: 41.8 % (ref 39.0–52.0)
Hemoglobin: 13.9 g/dL (ref 13.0–17.0)
Immature Granulocytes: 1 %
Lymphocytes Relative: 21 %
Lymphs Abs: 1.2 K/uL (ref 0.7–4.0)
MCH: 30.9 pg (ref 26.0–34.0)
MCHC: 33.3 g/dL (ref 30.0–36.0)
MCV: 92.9 fL (ref 80.0–100.0)
Monocytes Absolute: 0.5 K/uL (ref 0.1–1.0)
Monocytes Relative: 8 %
Neutro Abs: 4 K/uL (ref 1.7–7.7)
Neutrophils Relative %: 68 %
Platelets: 188 K/uL (ref 150–400)
RBC: 4.5 MIL/uL (ref 4.22–5.81)
RDW: 12.3 % (ref 11.5–15.5)
WBC: 5.9 K/uL (ref 4.0–10.5)
nRBC: 0 % (ref 0.0–0.2)

## 2024-09-30 LAB — COMPREHENSIVE METABOLIC PANEL WITH GFR
ALT: 15 U/L (ref 0–44)
AST: 29 U/L (ref 15–41)
Albumin: 3.8 g/dL (ref 3.5–5.0)
Alkaline Phosphatase: 49 U/L (ref 38–126)
Anion gap: 14 (ref 5–15)
BUN: 13 mg/dL (ref 6–20)
CO2: 22 mmol/L (ref 22–32)
Calcium: 9.1 mg/dL (ref 8.9–10.3)
Chloride: 105 mmol/L (ref 98–111)
Creatinine, Ser: 1.12 mg/dL (ref 0.61–1.24)
GFR, Estimated: >60 mL/min
Glucose, Bld: 102 mg/dL — ABNORMAL HIGH (ref 70–99)
Potassium: 4 mmol/L (ref 3.5–5.1)
Sodium: 141 mmol/L (ref 135–145)
Total Bilirubin: 0.4 mg/dL (ref 0.0–1.2)
Total Protein: 6.9 g/dL (ref 6.5–8.1)

## 2024-09-30 LAB — CK: Total CK: 205 U/L (ref 49–397)

## 2024-09-30 LAB — LACTIC ACID, PLASMA: Lactic Acid, Venous: 2.7 mmol/L (ref 0.5–1.9)

## 2024-09-30 MED ORDER — LEVETIRACETAM 500 MG PO TABS
500.0000 mg | ORAL_TABLET | Freq: Once | ORAL | Status: AC
  Administered 2024-09-30: 500 mg via ORAL
  Filled 2024-09-30: qty 1

## 2024-09-30 MED ORDER — LEVETIRACETAM 500 MG PO TABS: 500.0000 mg | ORAL_TABLET | Freq: Two times a day (BID) | ORAL | 0 refills | Status: AC

## 2024-09-30 NOTE — ED Notes (Signed)
 Fall risk bundle is currently in place.

## 2024-09-30 NOTE — ED Provider Notes (Signed)
 [facility] Provider Note   Event Date/Time   First MD Initiated Contact with Patient 09/30/24 [phone]     (approximate) History  Seizures  HPI Steven Kirk is a 48 y.o. male with a stated past medical history of seizure-like activity, hyperlipidemia, marijuana and tobacco abuse, and coronary artery disease who presents via EMS after having a seizure lasting a couple minutes.  Patient states that he does not remember any of this activity and states I just woke up and they were in my face.  Patient states that he has had 1 episode of seizures before and was recently seen by neurology and set up for an outpatient evaluation.  Patient states that he was not started on any antiepileptic medications.  The patient states that when he woke up and saw EMS, he was not confused, he had no bowel/bladder incontinence, and no intraoral trauma. ROS: Patient currently denies any vision changes, tinnitus, difficulty speaking, facial droop, sore throat, chest pain, shortness of breath, abdominal pain, nausea/vomiting/diarrhea, dysuria, or weakness/numbness/paresthesias in any extremity   Physical Exam  Triage Vital Signs: ED Triage Vitals  Encounter Vitals Group     BP 09/30/24 0312 139/80     Girls Systolic BP Percentile --      Girls Diastolic BP Percentile --      Boys Systolic BP Percentile --      Boys Diastolic BP Percentile --      Pulse Rate 09/30/24 0312 75     Resp 09/30/24 0312 17     Temp 09/30/24 0312 98.2 F (36.8 C)     Temp src --      SpO2 09/30/24 0309 99 %     Weight 09/30/24 0313 200 lb (90.7 kg)     Height 09/30/24 0313 5' 10 (1.778 m)     Head Circumference --      Peak Flow --      Pain Score 09/30/24 0313 0     Pain Loc --      Pain Education --      Exclude from Growth Chart --    Most recent vital signs: Vitals:   09/30/24 0330 09/30/24 0400  BP: 116/73 115/84  Pulse: 66 63  Resp: 16 18  Temp:    SpO2: 94% 97%   General: Awake,  oriented x4. CV:  Good peripheral perfusion. Resp:  Normal effort. Abd:  No distention. Other:  Middle-aged well-developed, well-nourished African-American male resting comfortably in no acute distress.  Small abrasion to the left lateral aspect of the tongue ED Results / Procedures / Treatments  Labs (all labs ordered are listed, but only abnormal results are displayed) Labs Reviewed  LACTIC ACID, PLASMA - Abnormal; Notable for the following components:      Result Value   Lactic Acid, Venous 2.7 (*)    All other components within normal limits  COMPREHENSIVE METABOLIC PANEL WITH GFR - Abnormal; Notable for the following components:   Glucose, Bld 102 (*)    All other components within normal limits  CK  CBC WITH DIFFERENTIAL/PLATELET  LACTIC ACID, PLASMA   PROCEDURES: Critical Care performed: No Procedures MEDICATIONS ORDERED IN ED: Medications  levETIRAcetam (KEPPRA) tablet 500 mg (has no administration in time range)   IMPRESSION / MDM / ASSESSMENT AND PLAN / ED COURSE  I reviewed the triage vital signs and the nursing notes.  The patient is on the cardiac monitor to evaluate for evidence of arrhythmia and/or significant heart rate changes. Patient's presentation is most consistent with acute presentation with potential threat to life or bodily function. Patient 48 year old male with the above-stated past medical history the presents for witnessed seizure activity via EMS. DDx: Seizure, seizure-like activity, syncope, hypnagogic jerking Plan: CBC, CMP, lactic acid, CK  As patient has mild intraoral trauma, elevated lactic acid, however normal CK, it is still unclear as to the etiology of patient's altered mental status and shaking.  As this is the second time patient has had these similar seizure-like activities, I will start patient on 500 mg Keppra twice daily prior to follow-up with his neurologist on the 28th for EEG and further epileptic  evaluation.  Patient has had no further seizure-like activity here without any intervention.  Patient agrees with plan for discharge at this time with prescription for Keppra.  Patient given strict return precautions and all questions answered prior to discharge.  Patient also given seizure precautions per [place] guidelines  Dispo: Discharge home with neurology follow-up   FINAL CLINICAL IMPRESSION(S) / ED DIAGNOSES   Final diagnoses:  Seizure-like activity (HCC)   Rx / DC Orders   ED Discharge Orders          Ordered    levETIRAcetam (KEPPRA) 500 MG tablet  2 times daily        09/30/24 0529           Note:  This document was prepared using Dragon voice recognition software and may include unintentional dictation errors.   Kieon Lawhorn K, MD 09/30/24 [phone]

## 2024-09-30 NOTE — ED Notes (Signed)
 PT in no acute distress prior to discharge. Discharged instructions reviewed, all questions answered and pt verbalized understanding at this time. Pt has all belongings with them at time of discharge.

## 2024-09-30 NOTE — ED Triage Notes (Signed)
 PT BIB [facility] from home after having a seizure lasting a couple minutes. Pt states I just woke up and they's was in my face. Pt has a hx of seizures last one a couple weeks ago. Pt states that he does not take anything for them. Pt Aox4 denies pain.

## 2024-10-11 ENCOUNTER — Ambulatory Visit: Admitting: Neurology

## 2024-10-11 DIAGNOSIS — R569 Unspecified convulsions: Secondary | ICD-10-CM | POA: Diagnosis not present

## 2024-10-11 NOTE — Procedures (Signed)
    History:  48 year old man with seizure  EEG classification: Awake and drowsy  Duration: 26 minutes   Technical aspects: This EEG study was done with scalp electrodes positioned according to the 10-20 International system of electrode placement. Electrical activity was reviewed with band pass filter of 1-70Hz , sensitivity of 7 uV/mm, display speed of 26mm/sec with a 60Hz  notched filter applied as appropriate. EEG data were recorded continuously and digitally stored.   Description of the recording: The background rhythms of this recording consists of a fairly well modulated medium amplitude alpha rhythm of 10 Hz that is reactive to eye opening and closure. Present in the anterior head region is a 15-20 Hz beta activity. Photic stimulation was performed, did not show any abnormalities. Hyperventilation was also performed, did not show any abnormalities. Drowsiness was manifested by background fragmentation. No abnormal epileptiform discharges seen during this recording. There was no focal slowing. There were no electrographic seizure identified.   Abnormality: None   Impression: This is a normal awake and drowsy EEG. No evidence of interictal epileptiform discharges. Normal EEGs, however, do not rule out epilepsy.    Jenai Scaletta, MD [facility]

## 2024-10-12 ENCOUNTER — Ambulatory Visit: Payer: Self-pay | Admitting: Neurology

## 2024-10-12 ENCOUNTER — Telehealth: Payer: Self-pay

## 2024-10-12 DIAGNOSIS — R569 Unspecified convulsions: Secondary | ICD-10-CM
